# Patient Record
Sex: Male | Born: 1960 | Race: White | Hispanic: Yes | Marital: Married | Smoking: Never smoker
Health system: Southern US, Community
[De-identification: ages and names within clinical notes are randomized; demographics above are authoritative.]

## PROBLEM LIST (undated history)

## (undated) DIAGNOSIS — M1990 Unspecified osteoarthritis, unspecified site: Secondary | ICD-10-CM

## (undated) DIAGNOSIS — E785 Hyperlipidemia, unspecified: Secondary | ICD-10-CM

## (undated) DIAGNOSIS — I10 Essential (primary) hypertension: Secondary | ICD-10-CM

## (undated) DIAGNOSIS — E119 Type 2 diabetes mellitus without complications: Secondary | ICD-10-CM

## (undated) HISTORY — DX: Unspecified osteoarthritis, unspecified site: M19.90

## (undated) HISTORY — DX: Type 2 diabetes mellitus without complications: E11.9

## (undated) HISTORY — DX: Essential (primary) hypertension: I10

## (undated) HISTORY — PX: OTHER SURGICAL HISTORY: SHX169

## (undated) HISTORY — DX: Hyperlipidemia, unspecified: E78.5

---

## 2003-02-10 ENCOUNTER — Emergency Department (HOSPITAL_COMMUNITY): Admission: EM | Admit: 2003-02-10 | Discharge: 2003-02-10 | Payer: Self-pay | Admitting: Emergency Medicine

## 2007-06-01 ENCOUNTER — Emergency Department (HOSPITAL_COMMUNITY): Admission: EM | Admit: 2007-06-01 | Discharge: 2007-06-01 | Payer: Self-pay | Admitting: Emergency Medicine

## 2009-01-31 IMAGING — CT CT HEAD W/O CM
1 series · 16 of 30 positions shown, 20 images · non-contrast
Comparison: None

CLINICAL DATA: Left leg, arm, and facial numbness.

CT HEAD WITHOUT CONTRAST
TECHNIQUE: Contiguous axial images were obtained from the base of
the skull through the vertex without contrast.

[Series 2: brain · axial · 0.47mm/px · z∈[+130,+283]mm · 16 of 32 slices shown, 20 images]
[im 2/32  brain]
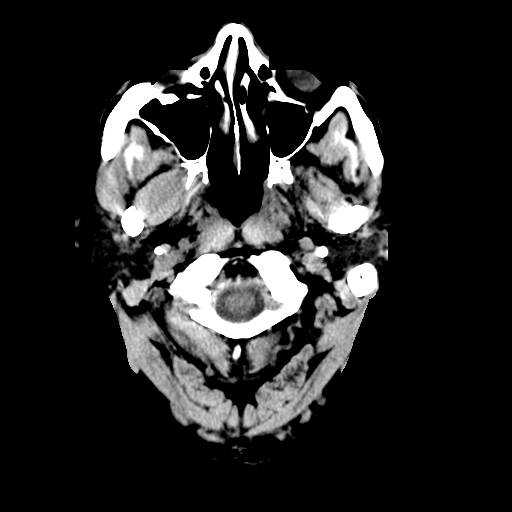
[im 2/32  bone]
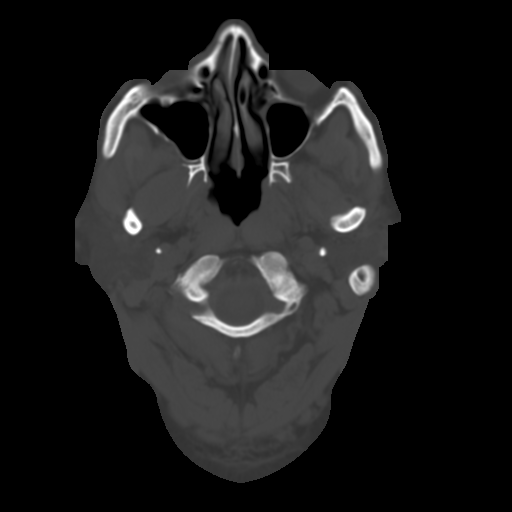
[im 4/32  brain]
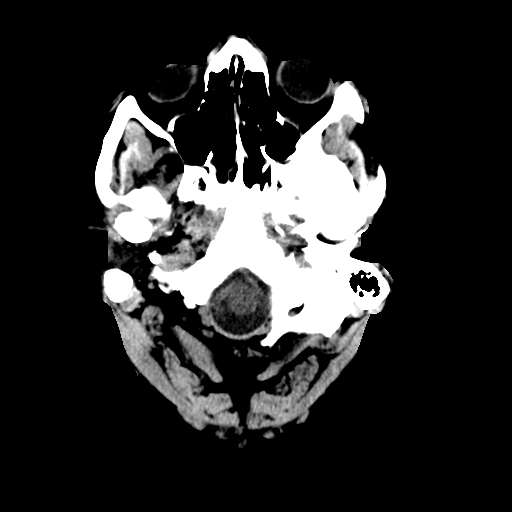
[im 6/32  brain]
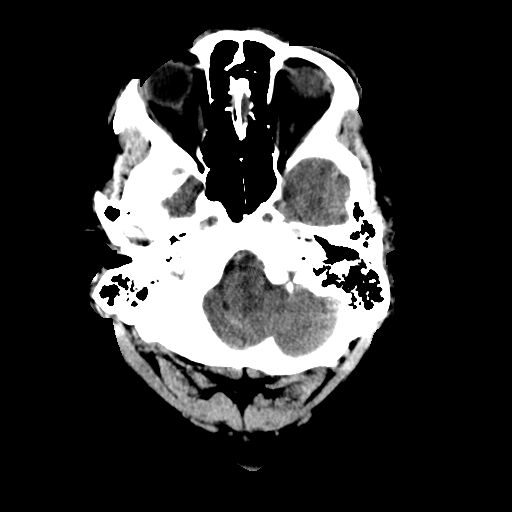
[im 8/32  brain]
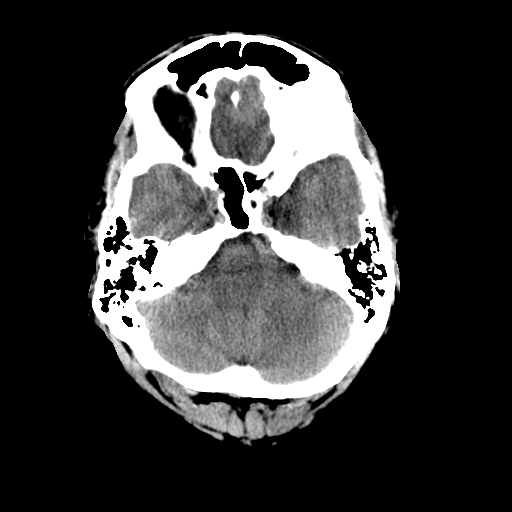
[im 9/32  brain]
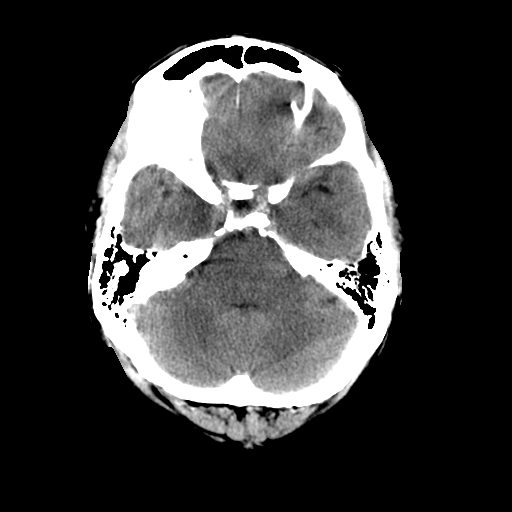
[im 9/32  bone]
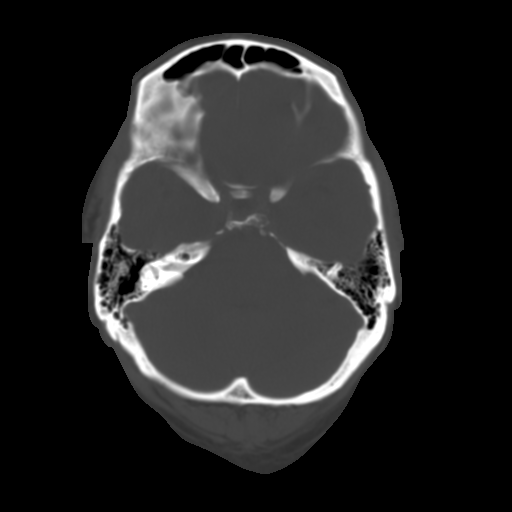
[im 11/32  brain]
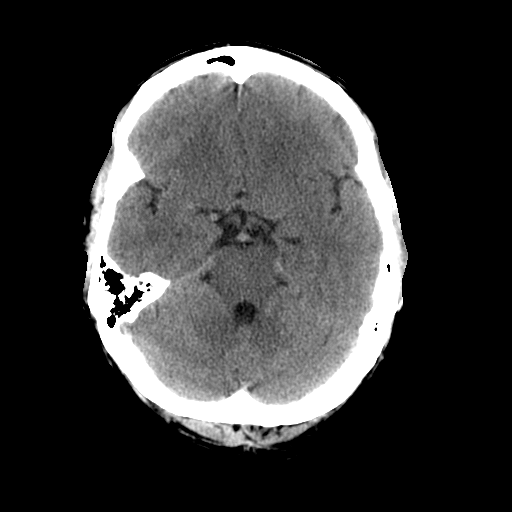
[im 13/32  brain]
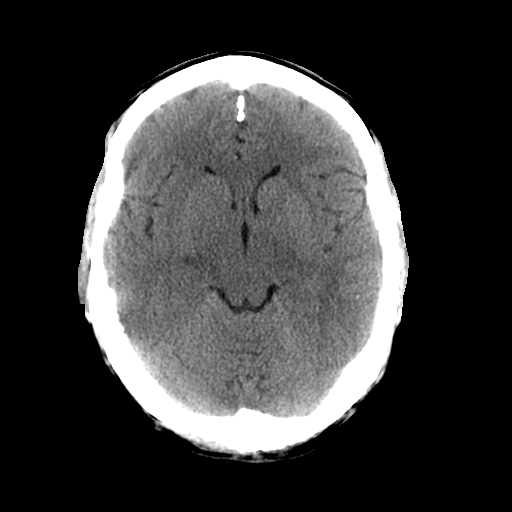
[im 15/32  brain]
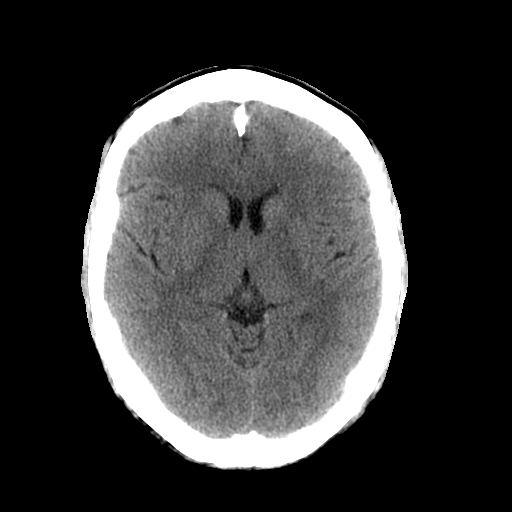
[im 17/32  brain]
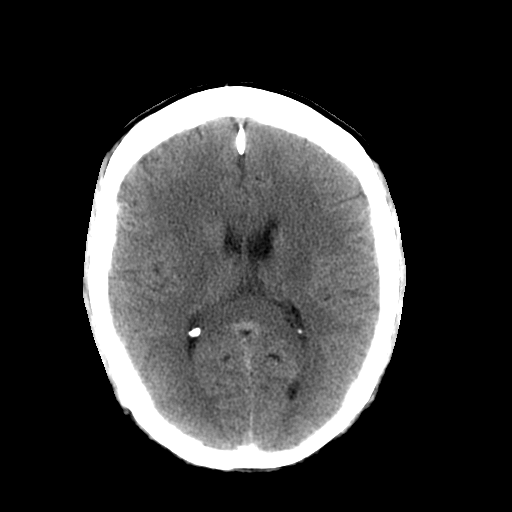
[im 17/32  bone]
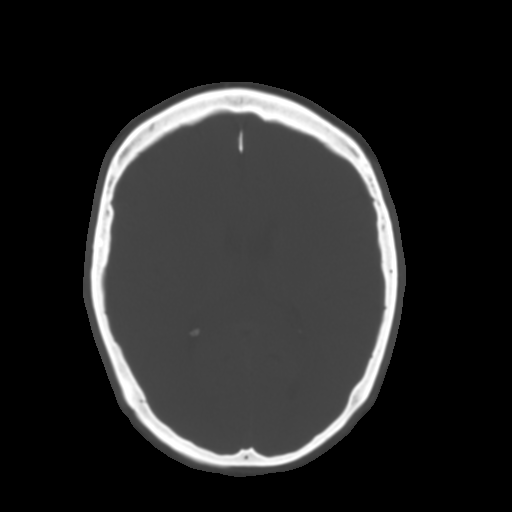
[im 19/32  brain]
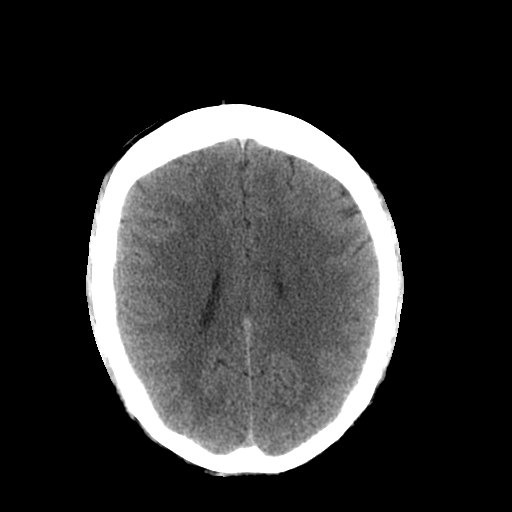
[im 21/32  brain]
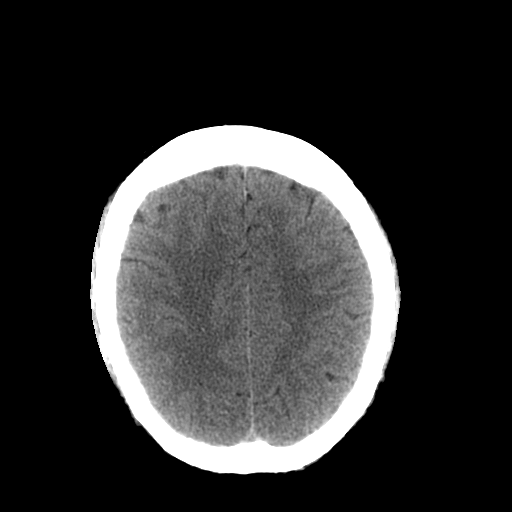
[im 23/32  brain]
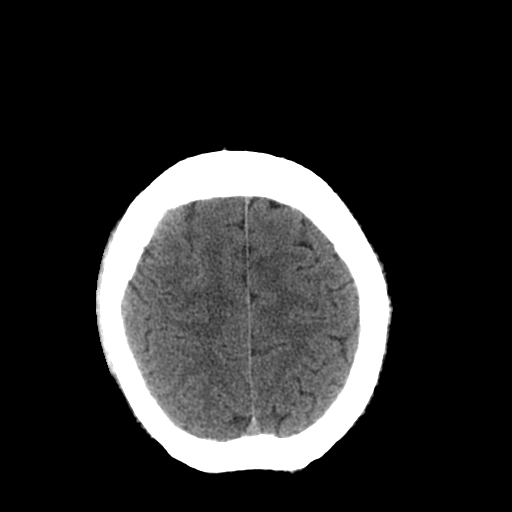
[im 24/32  brain]
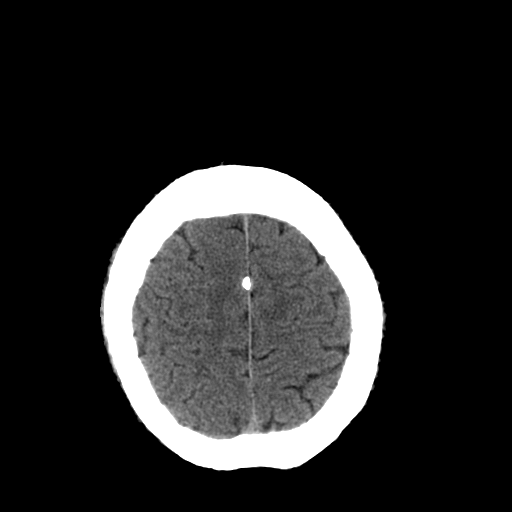
[im 24/32  bone]
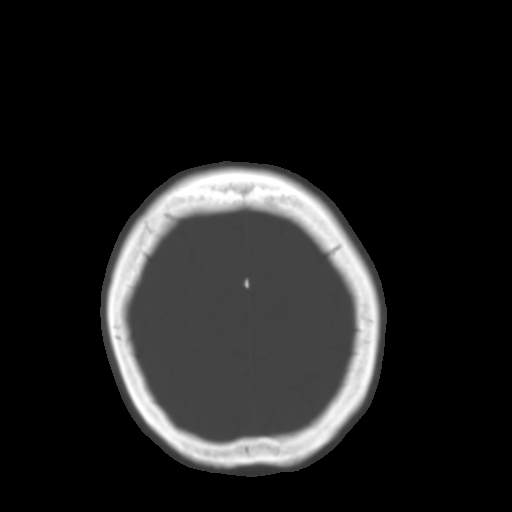
[im 26/32  brain]
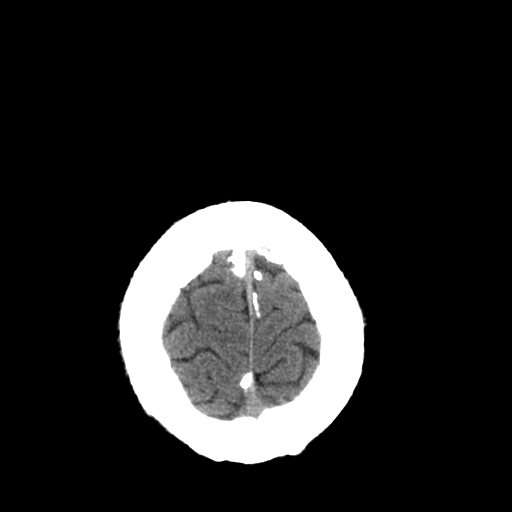
[im 28/32  brain]
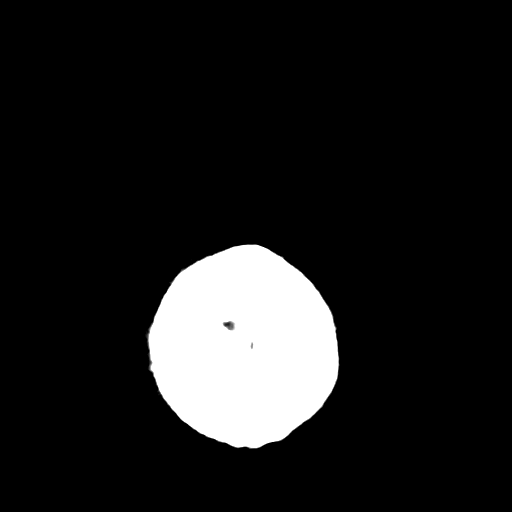
[im 30/32  brain]
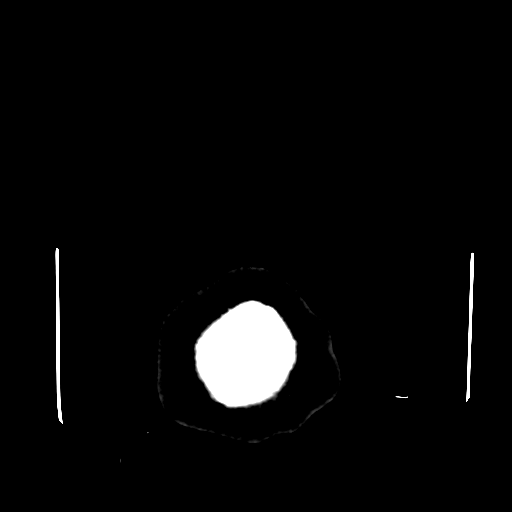

[16 of 30 positions shown; findings below may reference images not displayed]

FINDINGS: ]No acute intracranial abnormalities are identified,
including mass or mass effect, hydrocephalus, extra-axial fluid
collection, midline shift, hemorrhage, or acute infarct.  Please
note that acute infarct may be occult on CT for 24-48 hours.

The visualized bony calvarium is unremarkable.
IMPRESSION: No evidence of acute intracranial abnormality.

## 2010-11-29 LAB — COMPREHENSIVE METABOLIC PANEL
CO2: 27
Calcium: 9
Creatinine, Ser: 0.94
GFR calc Af Amer: >60
GFR calc non Af Amer: >60
Glucose, Bld: 117 — ABNORMAL HIGH
Total Protein: 6.2

## 2010-11-29 LAB — PROTIME-INR
INR: 0.9
Prothrombin Time: 12.7

## 2010-11-29 LAB — CBC
Hemoglobin: 14.5
MCHC: 34.3
MCV: 84.5
RBC: 4.99
RDW: 13.1

## 2010-11-29 LAB — APTT: aPTT: 31

## 2010-11-29 LAB — DIFFERENTIAL
Lymphocytes Relative: 30
Lymphs Abs: 3.2
Neutrophils Relative %: 61

## 2018-05-17 ENCOUNTER — Ambulatory Visit
Admission: RE | Admit: 2018-05-17 | Discharge: 2018-05-17 | Disposition: A | Discharge status: Home / Self Care | Payer: Self-pay | Source: Ambulatory Visit | Attending: Family Medicine | Admitting: Family Medicine

## 2018-05-17 ENCOUNTER — Other Ambulatory Visit: Payer: Self-pay | Admitting: Family Medicine

## 2018-05-17 DIAGNOSIS — R0789 Other chest pain: Secondary | ICD-10-CM

## 2018-05-29 ENCOUNTER — Encounter (INDEPENDENT_AMBULATORY_CARE_PROVIDER_SITE_OTHER): Payer: Self-pay

## 2018-05-29 ENCOUNTER — Encounter: Payer: Self-pay | Admitting: Cardiology

## 2018-05-29 ENCOUNTER — Other Ambulatory Visit: Payer: Self-pay

## 2018-05-29 ENCOUNTER — Ambulatory Visit: Payer: Self-pay | Admitting: Cardiology

## 2018-05-29 ENCOUNTER — Ambulatory Visit (INDEPENDENT_AMBULATORY_CARE_PROVIDER_SITE_OTHER): Payer: Self-pay | Admitting: Cardiology

## 2018-05-29 VITALS — BP 130/72 | HR 67 | Ht 70.0 in | Wt 217.8 lb

## 2018-05-29 DIAGNOSIS — R0602 Shortness of breath: Secondary | ICD-10-CM | POA: Insufficient documentation

## 2018-05-29 DIAGNOSIS — E785 Hyperlipidemia, unspecified: Secondary | ICD-10-CM | POA: Insufficient documentation

## 2018-05-29 DIAGNOSIS — R079 Chest pain, unspecified: Secondary | ICD-10-CM

## 2018-05-29 DIAGNOSIS — R9431 Abnormal electrocardiogram [ECG] [EKG]: Secondary | ICD-10-CM | POA: Insufficient documentation

## 2018-05-29 DIAGNOSIS — I10 Essential (primary) hypertension: Secondary | ICD-10-CM | POA: Insufficient documentation

## 2018-05-29 DIAGNOSIS — I152 Hypertension secondary to endocrine disorders: Secondary | ICD-10-CM | POA: Insufficient documentation

## 2018-05-29 NOTE — Assessment & Plan Note (Signed)
Somewhat atypical features for chest pain as it was pretty much consistent all weekend long and not made worse with exertion.  Unlikely to be anginal, however with the EKG being somewhat concerning for anterior ischemia, I would like to exclude anterior ischemia or any potential structural abnormality that would lead to this EKG.  Since he is not actively having symptoms, I do not feel that this is an emergency evaluation, therefore we will simply schedule him for a Lexiscan Myoview (EKG would not be readable for treadmill) and 2D echocardiogram.  Once these are able to be ordered after the lifting of restrictions from current COVID 19 isolation, we will go ahead and get this test ordered.

## 2018-05-29 NOTE — Assessment & Plan Note (Signed)
Interestingly, he says a lot of his shortness of breath was actually almost consistent with orthopnea.  Would like to get a sense of his EF but also any potential structural abnormality given the abnormal EKG. Plan: 2D echocardiogram.

## 2018-05-29 NOTE — Progress Notes (Signed)
PCP: Kendall Flack, MD  Clinic Note: Chief Complaint  Patient presents with  . New Patient (Initial Visit)    Chest pain, abnormal EKG    HPI: Michael Mitchell is a 58 y.o. male with a past medical history of hypertension and hyperlipidemia as well as prediabetes who is being seen today for the evaluation of INTERMITTENT CHEST PAIN with DYSPNEA ON EXERTION at the request of Kendall Flack, MD.  Michael Mitchell was last seen on May 14, 2018 by Dr. Dorena Cookey.  He described having constant left-sided chest pain described as pressure-like.  Made worse with anxiety and stress.  Alleviated at rest.  Also noted exertional dyspnea.  Also notes dry cough. -Prescribed aspirin as well as sublingual nitroglycerin.  PCP EKG shows sinus rhythm with right superior axis deviation.  Interventricular conduction delay/borderline block.  Precordial T wave inversions V1-V3.  Consider ischemia.  Nonspecific ST and T wave changes in V4-V6 as well as limb leads.  Recent Hospitalizations: None  Studies Personally Reviewed - (if available, images/films reviewed: From Epic Chart or Care Everywhere)  None  Interval History: [name] presents here today for cardiology evaluation, indicating that he really has not had any more symptoms since the weekend in question.  Apparently about 3 weekends ago he started having some constant pressure in the center of his chest that radiated to his left side of his back.  This symptom was pretty much present consistently throughout the weekend until Sunday afternoon when he went out to dinner and then counter forgot about the symptoms.  By Monday morning he woke up and felt better.  He did note that he has some dyspnea but not necessarily worse with exertion.  In fact he said that the chest discomfort and the dyspnea were worse with relaxing and lying back then was sitting up or walking.  He denied any PND or edema.  No rapid irregular heartbeats palpitations just feeling his  heart rate being little faster. No lightheadedness, dizziness or wooziness.  No syncope/near syncope, or TIA/amaurosis fugax symptoms. No melena, hematochezia, hematuria, or epstaxis. No claudication.  Since the Monday after the onset of symptoms, he has not had any further symptoms to speak of.  He says he feels fine now, and is not having any resting or exertional chest pain pressure or dyspnea.  He has not had 1 instance where he thought about potentially using nitroglycerin.  ROS: A comprehensive was performed. Review of Systems  Constitutional: Negative for chills, fever and malaise/fatigue.  Respiratory: Negative for shortness of breath (Only per HPI).   Gastrointestinal: Negative for constipation and heartburn.  Genitourinary: Negative.   Musculoskeletal: Negative.   Neurological: Negative for dizziness.  All other systems reviewed and are negative.    I have reviewed and (if needed) personally updated the patient's problem list, medications, allergies, past medical and surgical history, social and family history.   Past Medical History:  Diagnosis Date  . Essential hypertension    On amlodipine 2.5 mg & lisinopril-HCTZ 20-25 mg daily.  . Hyperlipidemia LDL goal <100    On Lipitor 40 mg daily    Past Surgical History:  Procedure Laterality Date  . None      Current Meds  Medication Sig  . atorvastatin (LIPITOR) 40 MG tablet Take 1 tablet by mouth daily.  Marland Kitchen lisinopril-hydrochlorothiazide (PRINZIDE,ZESTORETIC) 20-25 MG tablet Take 1 tablet by mouth daily.    No Known Allergies  Social History   Tobacco Use  . Smoking status: Never  Smoker  . Smokeless tobacco: Never Used  Substance Use Topics  . Alcohol use: Not on file  . Drug use: Not on file   Social History   Social History Narrative   Father of 2 daughters.    family history includes Diabetes Mellitus II in his father and mother; Hypertension in his mother.  Wt Readings from Last 3 Encounters:   05/29/18 217 lb 12.8 oz (98.8 kg)    PHYSICAL EXAM BP 130/72   Pulse 67   Ht 5\' 10"  (1.778 m)   Wt 217 lb 12.8 oz (98.8 kg)   BMI 31.25 kg/m  Physical Exam  Constitutional: He is oriented to person, place, and time. He appears well-developed and well-nourished. No distress.  Well-groomed.  Healthy-appearing  HENT:  Head: Normocephalic and atraumatic.  Neck: Normal range of motion. Neck supple. No hepatojugular reflux and no JVD present. Carotid bruit is not present.  Cardiovascular: Normal rate, regular rhythm, normal heart sounds and intact distal pulses.  No extrasystoles are present. PMI is not displaced. Exam reveals no gallop and no friction rub.  No murmur heard. Pulmonary/Chest: Effort normal and breath sounds normal. No respiratory distress. He has no wheezes. He has no rales. He exhibits no tenderness.  Abdominal: Soft. Bowel sounds are normal. He exhibits no distension. There is no abdominal tenderness. There is no rebound.  Musculoskeletal: Normal range of motion.        General: No edema.  Neurological: He is alert and oriented to person, place, and time.  Skin: Skin is warm and dry. No rash noted. No erythema.  Psychiatric: He has a normal mood and affect. Judgment and thought content normal.  Vitals reviewed.    Adult ECG Report  Rate: 67 ;  Rhythm: normal sinus rhythm and ~IVCD/borderline incomplete RBBB.  Biphasic T wave inversions in V1-V5 with nonspecific changes in other leads.  Otherwise normal axis, intervals and durations;   Narrative Interpretation: Borderline EKG   Other studies Reviewed: Additional studies/ records that were reviewed today include:  Recent Labs: Labs checked by PCP, but not available.   ASSESSMENT / PLAN: Problem List Items Addressed This Visit    Abnormal electrocardiogram   Relevant Orders   ECHOCARDIOGRAM COMPLETE   MYOCARDIAL PERFUSION IMAGING   Chest pain with moderate risk for cardiac etiology - Primary    Somewhat atypical  features for chest pain as it was pretty much consistent all weekend long and not made worse with exertion.  Unlikely to be anginal, however with the EKG being somewhat concerning for anterior ischemia, I would like to exclude anterior ischemia or any potential structural abnormality that would lead to this EKG.  Since he is not actively having symptoms, I do not feel that this is an emergency evaluation, therefore we will simply schedule him for a Lexiscan Myoview (EKG would not be readable for treadmill) and 2D echocardiogram.  Once these are able to be ordered after the lifting of restrictions from current COVID 19 isolation, we will go ahead and get this test ordered.      Essential hypertension (Chronic)    Well-controlled blood pressure on current medications.  No change for now.      Relevant Medications   atorvastatin (LIPITOR) 40 MG tablet   lisinopril-hydrochlorothiazide (PRINZIDE,ZESTORETIC) 20-25 MG tablet   Hyperlipidemia LDL goal <100 (Chronic)    On statin.  Managed by PCP.  Labs not available; depending on ischemic evaluation may need to be more strict in management.  Relevant Medications   atorvastatin (LIPITOR) 40 MG tablet   lisinopril-hydrochlorothiazide (PRINZIDE,ZESTORETIC) 20-25 MG tablet   Shortness of breath    Interestingly, he says a lot of his shortness of breath was actually almost consistent with orthopnea.  Would like to get a sense of his EF but also any potential structural abnormality given the abnormal EKG. Plan: 2D echocardiogram.      Relevant Orders   ECHOCARDIOGRAM COMPLETE   MYOCARDIAL PERFUSION IMAGING       I spent a total of 51minutes with the patient and chart review. >  50% of the time was spent in direct patient consultation.   Current medicines are reviewed at length with the patient today.  (+/- concerns) n/a The following changes have been made:  n/a  Patient Instructions  Medication Instructions:  Not needed If you need a  refill on your cardiac medications before your next appointment, please call your pharmacy.   Lab work: Not needed If you have labs (blood work) drawn today and your tests are completely normal, you will receive your results only by: Marland Kitchen MyChart Message (if you have MyChart) OR . A paper copy in the mail If you have any lab test that is abnormal or we need to change your treatment, we will call you to review the results.  Testing/Procedures:  Will be schedule at [place] has requested that you have an echocardiogram. Echocardiography is a painless test that uses sound waves to create images of your heart. It provides your doctor with information about the size and shape of your heart and how well your heart's chambers and valves are working. This procedure takes approximately one hour. There are no restrictions for this procedure. And  Will be schedule at [place] has requested that you have a lexiscan myoview. For further information please visit [email or web address]. Please follow instruction sheet, as given.      Follow-Up: At [facility], you and your health needs are our priority.  As part of our continuing mission to provide you with exceptional heart care, we have created designated Provider Care Teams.  These Care Teams include your primary Cardiologist (physician) and Advanced Practice Providers (APPs -  Physician Assistants and Nurse Practitioners) who all work together to provide you with the care you need, when you need it. . Your physician recommends that you schedule a follow-up appointment in 3 MONTHS AFTER TEST IS COMPLETED .   Any Other Special Instructions Will Be Listed Below (If Applicable).       Studies Ordered:   Orders Placed This Encounter  Procedures  . MYOCARDIAL PERFUSION IMAGING  . ECHOCARDIOGRAM COMPLETE      Glenetta Hew, M.D., M.S. Interventional Cardiologist    Pager # [phone] Phone # [phone] [address] [place],  [address]   Thank you for choosing Heartcare at [facility]!!

## 2018-05-29 NOTE — Patient Instructions (Signed)
Medication Instructions:  Not needed If you need a refill on your cardiac medications before your next appointment, please call your pharmacy.   Lab work: Not needed If you have labs (blood work) drawn today and your tests are completely normal, you will receive your results only by: Marland Kitchen MyChart Message (if you have MyChart) OR . A paper copy in the mail If you have any lab test that is abnormal or we need to change your treatment, we will call you to review the results.  Testing/Procedures:  Will be schedule at [place] has requested that you have an echocardiogram. Echocardiography is a painless test that uses sound waves to create images of your heart. It provides your doctor with information about the size and shape of your heart and how well your heart's chambers and valves are working. This procedure takes approximately one hour. There are no restrictions for this procedure. And  Will be schedule at [place] has requested that you have a lexiscan myoview. For further information please visit [email or web address]. Please follow instruction sheet, as given.      Follow-Up: At [facility], you and your health needs are our priority.  As part of our continuing mission to provide you with exceptional heart care, we have created designated Provider Care Teams.  These Care Teams include your primary Cardiologist (physician) and Advanced Practice Providers (APPs -  Physician Assistants and Nurse Practitioners) who all work together to provide you with the care you need, when you need it. . Your physician recommends that you schedule a follow-up appointment in 3 MONTHS AFTER TEST IS COMPLETED .   Any Other Special Instructions Will Be Listed Below (If Applicable).

## 2018-05-29 NOTE — Assessment & Plan Note (Signed)
Well-controlled blood pressure on current medications.  No change for now.

## 2018-05-29 NOTE — Assessment & Plan Note (Signed)
On statin.  Managed by PCP.  Labs not available; depending on ischemic evaluation may need to be more strict in management.

## 2018-06-01 NOTE — Addendum Note (Signed)
Addended by: Zebedee Iba on: 06/01/2018 08:22 AM   Modules accepted: Orders

## 2018-07-26 ENCOUNTER — Inpatient Hospital Stay (HOSPITAL_COMMUNITY): Admission: RE | Admit: 2018-07-26 | Payer: Self-pay | Source: Ambulatory Visit

## 2018-08-06 ENCOUNTER — Telehealth (HOSPITAL_COMMUNITY): Payer: Self-pay

## 2018-08-06 NOTE — Telephone Encounter (Signed)

## 2018-08-06 NOTE — Telephone Encounter (Signed)
LMTCB COVID prescreening for echo. 

## 2018-08-07 ENCOUNTER — Ambulatory Visit (HOSPITAL_COMMUNITY): Payer: BC Managed Care – PPO | Attending: Cardiology

## 2018-08-07 ENCOUNTER — Other Ambulatory Visit: Payer: Self-pay

## 2018-08-07 DIAGNOSIS — R0602 Shortness of breath: Secondary | ICD-10-CM | POA: Insufficient documentation

## 2018-08-07 DIAGNOSIS — R9431 Abnormal electrocardiogram [ECG] [EKG]: Secondary | ICD-10-CM | POA: Diagnosis present

## 2018-08-08 NOTE — Progress Notes (Signed)
Echo results: Good news: Essentially normal echocardiogram and normal pump function and normal valve function.  No signs to suggest heart attack.. EF: 50-55%. No regional wall motion abnormalities

## 2018-09-04 ENCOUNTER — Encounter: Payer: Self-pay | Admitting: *Deleted

## 2018-09-05 ENCOUNTER — Encounter (HOSPITAL_COMMUNITY): Payer: Self-pay | Admitting: *Deleted

## 2018-09-05 ENCOUNTER — Ambulatory Visit (HOSPITAL_COMMUNITY)
Admission: RE | Admit: 2018-09-05 | Discharge: 2018-09-05 | Disposition: A | Discharge status: Home / Self Care | Payer: BC Managed Care – PPO | Source: Ambulatory Visit | Attending: Cardiovascular Disease | Admitting: Cardiovascular Disease

## 2018-09-05 ENCOUNTER — Other Ambulatory Visit: Payer: Self-pay

## 2018-09-05 DIAGNOSIS — R0602 Shortness of breath: Secondary | ICD-10-CM | POA: Insufficient documentation

## 2018-09-05 DIAGNOSIS — R9431 Abnormal electrocardiogram [ECG] [EKG]: Secondary | ICD-10-CM | POA: Diagnosis not present

## 2018-09-05 LAB — MYOCARDIAL PERFUSION IMAGING
LV dias vol: 114 mL (ref 62–150)
LV sys vol: 56 mL
Peak HR: 91 {beats}/min
Rest HR: 62 {beats}/min
SDS: 2
SRS: 1
SSS: 3
TID: 1.11

## 2018-09-05 MED ORDER — TECHNETIUM TC 99M TETROFOSMIN IV KIT
10.3000 | PACK | Freq: Once | INTRAVENOUS | Status: AC | PRN
  Administered 2018-09-05: 10.3 via INTRAVENOUS
  Filled 2018-09-05: qty 11

## 2018-09-05 MED ORDER — TECHNETIUM TC 99M TETROFOSMIN IV KIT
29.9000 | PACK | Freq: Once | INTRAVENOUS | Status: AC | PRN
  Administered 2018-09-05: 29.9 via INTRAVENOUS
  Filled 2018-09-05: qty 30

## 2018-09-05 MED ORDER — REGADENOSON 0.4 MG/5ML IV SOLN
0.4000 mg | Freq: Once | INTRAVENOUS | Status: AC
  Administered 2018-09-05: 0.4 mg via INTRAVENOUS

## 2018-09-05 NOTE — Progress Notes (Signed)
Michael Mitchell (CAP) [phone] interpreter assisting with MPI study today.

## 2018-11-08 ENCOUNTER — Encounter: Payer: Self-pay | Admitting: Emergency Medicine

## 2018-11-08 ENCOUNTER — Other Ambulatory Visit: Payer: Self-pay

## 2018-11-08 ENCOUNTER — Ambulatory Visit: Payer: BC Managed Care – PPO | Admitting: Emergency Medicine

## 2018-11-08 VITALS — BP 120/74 | HR 71 | Temp 98.7°F | Resp 18 | Ht 66.93 in | Wt 217.6 lb

## 2018-11-08 DIAGNOSIS — I10 Essential (primary) hypertension: Secondary | ICD-10-CM

## 2018-11-08 DIAGNOSIS — N529 Male erectile dysfunction, unspecified: Secondary | ICD-10-CM | POA: Diagnosis not present

## 2018-11-08 DIAGNOSIS — E1165 Type 2 diabetes mellitus with hyperglycemia: Secondary | ICD-10-CM

## 2018-11-08 DIAGNOSIS — Z1211 Encounter for screening for malignant neoplasm of colon: Secondary | ICD-10-CM

## 2018-11-08 DIAGNOSIS — Z23 Encounter for immunization: Secondary | ICD-10-CM

## 2018-11-08 DIAGNOSIS — E785 Hyperlipidemia, unspecified: Secondary | ICD-10-CM

## 2018-11-08 DIAGNOSIS — Z7689 Persons encountering health services in other specified circumstances: Secondary | ICD-10-CM

## 2018-11-08 MED ORDER — METFORMIN HCL 500 MG PO TABS: 500.0000 mg | ORAL_TABLET | Freq: Every day | ORAL | 3 refills | Status: DC

## 2018-11-08 MED ORDER — ATORVASTATIN CALCIUM 40 MG PO TABS: 40.0000 mg | ORAL_TABLET | Freq: Every day | ORAL | 3 refills | Status: DC

## 2018-11-08 MED ORDER — LISINOPRIL-HYDROCHLOROTHIAZIDE 20-25 MG PO TABS: 1.0000 | ORAL_TABLET | Freq: Every day | ORAL | 3 refills | Status: DC

## 2018-11-08 MED ORDER — SILDENAFIL CITRATE 100 MG PO TABS: 50.0000 mg | ORAL_TABLET | Freq: Every day | ORAL | 11 refills | Status: DC | PRN

## 2018-11-08 NOTE — Progress Notes (Signed)
Michael Mitchell 58 y.o.   Chief Complaint  Patient presents with   Establish Care    No concerns today.  Previous PCP left area.     HISTORY OF PRESENT ILLNESS: This is a 58 y.o. male here to establish care with me.  Has the following chronic medical problems: 1.  Hypertension: On Zestoretic 20-25 once a day. 2.  Diabetes type 2: On metformin 500 mg twice a day. 3.  Dyslipidemia: On Lipitor 40 mg once a day. Concerned about erectile dysfunction. No other complaints or medical concerns today.  HPI   Prior to Admission medications   Medication Sig Start Date End Date Taking? Authorizing Provider  metFORMIN (GLUCOPHAGE) 500 MG tablet Take 500 mg by mouth daily at 6 PM.   Yes [provider]  atorvastatin (LIPITOR) 40 MG tablet Take 1 tablet by mouth daily. 12/23/17   [provider]  lisinopril-hydrochlorothiazide (PRINZIDE,ZESTORETIC) 20-25 MG tablet Take 1 tablet by mouth daily.    [provider]    No Known Allergies  Patient Active Problem List   Diagnosis Date Noted   Abnormal electrocardiogram 05/29/2018   Essential hypertension 05/29/2018   Hyperlipidemia LDL goal <100 05/29/2018    Past Medical History:  Diagnosis Date   Diabetes mellitus without complication ([place])    Essential hypertension    On amlodipine 2.5 mg & lisinopril-HCTZ 20-25 mg daily.   Hyperlipidemia LDL goal <100    On Lipitor 40 mg daily    Past Surgical History:  Procedure Laterality Date   None      Social History   Socioeconomic History   Marital status: Married    Spouse name: Not on file   Number of children: Not on file   Years of education: Not on file   Highest education level: Not on file  Occupational History   Not on file  Social Needs   Financial resource strain: Not on file   Food insecurity    Worry: Not on file    Inability: Not on file   Transportation needs    Medical: Not on file    Non-medical: Not on file  Tobacco  Use   Smoking status: Never Smoker   Smokeless tobacco: Never Used  Substance and Sexual Activity   Alcohol use: Never    Frequency: Never   Drug use: Never   Sexual activity: Not on file  Lifestyle   Physical activity    Days per week: Not on file    Minutes per session: Not on file   Stress: Not on file  Relationships   Social connections    Talks on phone: Not on file    Gets together: Not on file    Attends religious service: Not on file    Active member of club or organization: Not on file    Attends meetings of clubs or organizations: Not on file    Relationship status: Not on file   Intimate partner violence    Fear of current or ex partner: Not on file    Emotionally abused: Not on file    Physically abused: Not on file    Forced sexual activity: Not on file  Other Topics Concern   Not on file  Social History Narrative   Father of 2 daughters.    Family History  Problem Relation Age of Onset   Diabetes Mellitus II Mother    Hypertension Mother    Diabetes Mellitus II Father  Review of Systems  Constitutional: Negative.  Negative for chills, fever and weight loss.  HENT: Negative.  Negative for congestion and sore throat.   Eyes: Negative.   Respiratory: Negative.  Negative for cough and shortness of breath.   Cardiovascular: Negative.  Negative for chest pain and palpitations.  Gastrointestinal: Negative for abdominal pain, blood in stool, diarrhea, melena, nausea and vomiting.  Genitourinary: Negative.  Negative for dysuria and hematuria.       Erectile dysfunction  Musculoskeletal: Negative.  Negative for back pain, myalgias and neck pain.  Skin: Negative.  Negative for rash.  Neurological: Negative for dizziness and headaches.  All other systems reviewed and are negative.  Today's Vitals   11/08/18 1354  BP: 120/74  Pulse: 71  Resp: 18  Temp: 98.7 F (37.1 C)  TempSrc: Oral  SpO2: 95%  Weight: 217 lb 9.6 oz (98.7 kg)  Height:  5' 6.93" (1.7 m)   Body mass index is 34.15 kg/m.   Physical Exam Vitals signs reviewed.  Constitutional:      Appearance: Normal appearance.  HENT:     Head: Normocephalic and atraumatic.  Eyes:     Extraocular Movements: Extraocular movements intact.     Conjunctiva/sclera: Conjunctivae normal.     Pupils: Pupils are equal, round, and reactive to light.  Neck:     Musculoskeletal: Normal range of motion and neck supple.  Cardiovascular:     Rate and Rhythm: Normal rate and regular rhythm.     Pulses: Normal pulses.     Heart sounds: Normal heart sounds.  Pulmonary:     Effort: Pulmonary effort is normal.     Breath sounds: Normal breath sounds.  Abdominal:     Palpations: Abdomen is soft.     Tenderness: There is no abdominal tenderness.  Musculoskeletal: Normal range of motion.  Skin:    General: Skin is warm and dry.     Capillary Refill: Capillary refill takes less than 2 seconds.  Neurological:     General: No focal deficit present.     Mental Status: He is alert and oriented to person, place, and time.  Psychiatric:        Mood and Affect: Mood normal.        Behavior: Behavior normal.      ASSESSMENT & PLAN: Michai was seen today for establish care.  Diagnoses and all orders for this visit:  Essential hypertension -     lisinopril-hydrochlorothiazide (ZESTORETIC) 20-25 MG tablet; Take 1 tablet by mouth daily.  Hyperlipidemia LDL goal <100 -     atorvastatin (LIPITOR) 40 MG tablet; Take 1 tablet (40 mg total) by mouth daily.  Encounter to establish care  Need for prophylactic vaccination and inoculation against influenza -     Flu Vaccine QUAD 6+ mos PF IM (Fluarix Quad PF)  Type 2 diabetes mellitus with hyperglycemia, without long-term current use of insulin (HCC) -     metFORMIN (GLUCOPHAGE) 500 MG tablet; Take 1 tablet (500 mg total) by mouth daily at 6 PM.  Erectile dysfunction, unspecified erectile dysfunction type -     sildenafil (VIAGRA)  100 MG tablet; Take 0.5-1 tablets (50-100 mg total) by mouth daily as needed for erectile dysfunction.  Colon cancer screening -     Ambulatory referral to Gastroenterology    Patient Instructions     Hipertensin en los adultos Hypertension, Adult El trmino hipertensin es otra forma de denominar a la presin arterial elevada. La presin arterial elevada fuerza al  corazn a trabajar ms para Herbalist. Esto puede causar problemas con el paso del [place]. Una lectura de presin arterial est compuesta por 2 nmeros. Hay un nmero superior (sistlico) sobre un nmero inferior (diastlico). Lo ideal es tener la presin arterial por debajo de 120/80. Las elecciones saludables pueden ayudar a Engineer, materials presin arterial, o tal vez necesite medicamentos para bajarla. Cules son las causas? Se desconoce la causa de esta afeccin. Algunas afecciones pueden estar relacionadas con la presin arterial alta. Qu incrementa el riesgo?  Fumar.  Tener diabetes mellitus tipo 2, colesterol alto, o ambos.  No hacer la cantidad suficiente de actividad fsica o ejercicio.  Tener sobrepeso.  Consumir mucha grasa, azcar, caloras o sal (sodio) en su dieta.  Beber alcohol en exceso.  Tener una enfermedad renal a largo plazo (crnica).  Tener antecedentes familiares de presin arterial alta.  Edad. Los riesgos aumentan con la edad.  [place]aza. El riesgo es mayor para las Retail banker.  Sexo. Antes de los 45aos, los hombres corren ms [employer]. Despus de los 65aos, las mujeres corren ms [employer].  Tener apnea obstructiva del sueo.  Estrs. Cules son los signos o los sntomas?  Es posible que la presin arterial alta puede no cause sntomas. La presin arterial muy alta (crisis hipertensiva) puede provocar: ? Dolor de [place]. ? Sensaciones de preocupacin o nerviosismo (ansiedad). ? Falta de aire. ? Hemorragia nasal. ? Sensacin de  Engineer, site (nuseas). ? Vmitos. ? Cambios en la forma de ver. ? Dolor muy intenso en el pecho. ? Convulsiones. Cmo se trata?  Esta afeccin se trata haciendo cambios saludables en el estilo de vida, por ejemplo: ? Consumir alimentos saludables. ? Hacer ms ejercicio. ? Beber menos alcohol.  El mdico puede recetarle medicamentos si los cambios en el estilo de vida no son suficientes para Child psychotherapist la presin arterial y si: ? El nmero de arriba est por encima de 130. ? El nmero de abajo est por encima de 80.  Su presin arterial personal ideal puede variar. Siga estas instrucciones en su casa: Comida y bebida   Si se lo dicen, siga el plan de alimentacin de DASH (Dietary Approaches to Stop Hypertension, Maneras de alimentarse para detener la hipertensin). Para seguir este plan: ? Llene la mitad del plato de cada comida con frutas y verduras. ? Llene un cuarto del plato de cada comida con cereales integrales. Los cereales integrales incluyen pasta integral, arroz integral y pan integral. ? Coma y beba productos lcteos con bajo contenido de grasa, como leche descremada o yogur bajo en grasas. ? Llene un cuarto del plato de cada comida con protenas bajas en grasa (magras). Las protenas bajas en grasa incluyen pescado, pollo sin piel, huevos, frijoles y tofu. ? Evite consumir carne grasa, carne curada y procesada, o pollo con piel. ? Evite consumir alimentos prehechos o procesados.  Consuma menos de 1500 mg de sal por da.  No beba alcohol si: ? El mdico le indica que no lo haga. ? Est embarazada, puede estar embarazada o est tratando de quedar embarazada.  Si bebe alcohol: ? Limite la cantidad que bebe a lo siguiente:  De 0 a 1 medida por da para las mujeres.  De 0 a 2 medidas por da para los hombres. ? Est atento a la cantidad de alcohol que hay en las bebidas que toma. En los [place], una medida [place] a una botella de Chartered certified accountant de  12oz (339ml), un vaso de vino de 5oz (127ml) o un vaso de una bebida alcohlica de alta graduacin de 1oz (78ml). Estilo de vida   Trabaje con su mdico para mantenerse en un peso saludable o para perder peso. Pregntele a su mdico cul es el peso recomendable para usted.  Haga al menos 52minutos de ejercicio la [employer] de la [place]. Estos pueden incluir caminar, nadar o andar en bicicleta.  Realice al menos 30 minutos de ejercicio que fortalezca sus msculos (ejercicios de resistencia) al menos 3 das a la [place]. Estos pueden incluir levantar pesas o hacer [place].  No consuma ningn producto que contenga nicotina o tabaco, como cigarrillos, cigarrillos electrnicos y tabaco de Higher education careers adviser. Si necesita ayuda para dejar de fumar, consulte al [employer].  Controle su presin arterial en su casa tal como le indic el mdico.  Concurra a todas las visitas de seguimiento como se lo haya indicado el mdico. Esto es importante. Medicamentos  [employer] de venta libre y los recetados solamente como se lo haya indicado el mdico. Siga cuidadosamente las indicaciones.  No omita las dosis de medicamentos para la presin arterial. Los medicamentos pierden eficacia si omite dosis. El hecho de omitir las dosis tambin [place] el riesgo de otros problemas.  Pregntele a su mdico a qu efectos secundarios o reacciones a los Careers information officer. Comunquese con un mdico si:  Piensa que tiene [place] reaccin a los medicamentos que est tomando.  Tiene dolores de cabeza frecuentes (recurrentes).  Se siente mareado.  Tiene hinchazn en los tobillos.  Tiene problemas de visin. Solicite ayuda inmediatamente si:  Siente un dolor de cabeza muy intenso.  Empieza a sentirse desorientado (confundido).  Se siente dbil o adormecido.  Siente que va a desmayarse.  Tiene un dolor muy intenso en las siguientes zonas: ? Pecho. ? Vientre (abdomen).  Vomita ms de  una vez.  Tiene dificultad para respirar. Resumen  El trmino hipertensin es otra forma de denominar a la presin arterial elevada.  La presin arterial elevada fuerza al corazn a trabajar ms para bombear la sangre.  Para la [employer], una presin arterial normal es menor que 120/80.  Las decisiones saludables pueden ayudarle a disminuir su presin arterial. Si no puede bajar su presin arterial mediante decisiones saludables, es posible que deba tomar medicamentos. Esta informacin no tiene Marine scientist el consejo del mdico. Asegrese de hacerle al mdico cualquier pregunta que tenga. Document Released: 08/11/2009 Document Revised: 12/07/2017 Document Reviewed: 12/07/2017 Elsevier Patient Education  2020 [employer].  Diabetes mellitus y nutricin, en adultos Diabetes Mellitus and Nutrition, Adult Si sufre de diabetes (diabetes mellitus), es muy importante tener hbitos alimenticios saludables debido a que sus niveles de Designer, television/film set sangre (glucosa) se ven afectados en gran medida por lo que come y bebe. [place] alimentos saludables en las cantidades [place], aproximadamente a la [employer], [place] ayudar a:  Aeronautical engineer glucemia.  Disminuir el riesgo de sufrir una enfermedad cardaca.  Mejorar la presin arterial.  Science writer o mantener un peso saludable. Todas las personas que sufren de diabetes son diferentes y cada una tiene necesidades diferentes en cuanto a un plan de alimentacin. El mdico puede recomendarle que trabaje con un especialista en dietas y nutricin (nutricionista) para Financial trader plan para usted. Su plan de alimentacin puede variar segn factores como:  Las caloras que necesita.  Los medicamentos que toma.  Su peso.  Sus niveles  de glucemia, presin arterial y colesterol.  Su nivel de [place].  Otras afecciones que tenga, como enfermedades cardacas o renales. Cmo me afectan los carbohidratos? Los  carbohidratos, o hidratos de carbono, afectan su nivel de glucemia ms que cualquier otro tipo de alimento. La ingesta de carbohidratos naturalmente aumenta la cantidad de [employer]. El recuento de carbohidratos es un mtodo destinado a Catering manager un registro de la cantidad de carbohidratos que se consumen. El recuento de carbohidratos es importante para Theatre manager la glucemia a un nivel saludable, especialmente si utiliza insulina o toma determinados medicamentos por va oral para la diabetes. Es importante conocer la cantidad de carbohidratos que se pueden ingerir en cada comida sin correr Engineer, manufacturing. Esto es Psychologist, forensic. Su nutricionista puede ayudarlo a calcular la cantidad de carbohidratos que debe ingerir en cada comida y en cada refrigerio. Entre los alimentos que contienen carbohidratos, se incluyen:  Pan, cereal, arroz, pastas y galletas.  Papas y maz.  Guisantes, frijoles y lentejas.  Leche y Estate agent.  Lambert Mody y [place].  Postres, como pasteles, galletas, helado y caramelos. Cmo me afecta el alcohol? El alcohol puede provocar disminuciones sbitas de la glucemia (hipoglucemia), especialmente si utiliza insulina o toma determinados medicamentos por va oral para la diabetes. La hipoglucemia es una afeccin potencialmente mortal. Los sntomas de la hipoglucemia (somnolencia, mareos y confusin) son similares a los sntomas de haber consumido demasiado alcohol. Si el mdico afirma que el alcohol es seguro para usted, [place] estas pautas:  Limite el consumo de alcohol a no ms de 19medida por da si es mujer y no est [place], y a 25medidas si es hombre. Una medida equivale a 12oz (323ml) de cerveza, 5oz (143ml) de vino o 1oz (71ml) de bebidas alcohlicas de alta graduacin.  No beba con el estmago vaco.  Mantngase hidratado bebiendo agua, refrescos dietticos o t helado sin azcar.  Tenga en cuenta que los refrescos comunes, los jugos y otras bebida para  Optician, dispensing pueden contener mucha azcar y se deben contar como carbohidratos. Cules son algunos consejos para seguir este plan?  Leer las etiquetas de los alimentos  Comience por leer el tamao de la porcin en la Informacin nutricional en las etiquetas de los alimentos envasados y las bebidas. La cantidad de caloras, carbohidratos, grasas y otros nutrientes mencionados en la etiqueta se basan en una porcin del alimento. Muchos alimentos contienen ms de una porcin por envase.  Verifique la cantidad total de gramos (g) de carbohidratos totales en una porcin. Puede calcular la cantidad de porciones de carbohidratos al dividir el total de carbohidratos por 15. Por ejemplo, si un alimento tiene un total de 30g de carbohidratos, equivale a 2 porciones de carbohidratos.  Verifique la cantidad de gramos (g) de grasas saturadas y grasas trans en una porcin. Escoja alimentos que no contengan grasa o que tengan un bajo contenido.  Verifique la cantidad de miligramos (mg) de sal (sodio) en una porcin. La mayora de las personas deben limitar la ingesta de sodio total a menos de 2300mg  por Training and development officer.  Siempre consulte la informacin nutricional de los alimentos etiquetados como con bajo contenido de [place] o sin grasa. Estos alimentos pueden tener un mayor contenido de Location manager agregada o carbohidratos refinados, y deben evitarse.  Hable con su nutricionista para identificar sus objetivos diarios en cuanto a los nutrientes mencionados en la etiqueta. Al ir de compras  Evite comprar alimentos procesados, enlatados o precocinados. Estos alimentos tienden a Special educational needs teacher mayor cantidad de  Anitra Lauth y azcar agregada.  Compre en la zona exterior de la tienda de comestibles. Esta zona incluye frutas y verduras frescas, granos a granel, carnes frescas y productos lcteos frescos. Al cocinar  Utilice mtodos de coccin a baja temperatura, como hornear, en lugar de mtodos de coccin a alta temperatura, como  frer en abundante aceite.  Cocine con aceites saludables, como el aceite de [place], canola o [place].  Evite cocinar con manteca, crema o carnes con alto contenido de grasa. Planificacin de las comidas  Coma las comidas y los refrigerios regularmente, preferentemente a la misma hora todos [place]. Evite pasar largos perodos de tiempo sin comer.  Consuma alimentos ricos en fibra, como frutas frescas, verduras, frijoles y cereales integrales. Consulte a su nutricionista sobre cuntas porciones de carbohidratos puede consumir en cada comida.  Consuma entre 4 y 6 onzas (oz) de protenas magras por da, como carnes [place], pollo, pescado, huevos o tofu. Una onza de protena magra equivale a: ? 1 onza de carne, pollo o pescado. ? 1huevo. ?  taza de tofu.  Coma algunos alimentos por da que contengan grasas saludables, como aguacates, frutos secos, semillas y pescado. Estilo de vida  Controle su nivel de glucemia con regularidad.  Haga actividad fsica habitualmente como se lo haya indicado el mdico. Esto puede incluir lo siguiente: ? 138minutos semanales de ejercicio de intensidad moderada o alta. Esto podra incluir caminatas dinmicas, ciclismo o gimnasia acutica. ? Realizar ejercicios de elongacin y de fortalecimiento, como yoga o levantamiento de pesas, por lo menos 2veces por semana.  Tome los [employer] se lo haya indicado el mdico.  No consuma ningn producto que contenga nicotina o tabaco, como cigarrillos y Psychologist, sport and exercise. Si necesita ayuda para dejar de fumar, consulte al [employer] con un asesor o instructor en diabetes para identificar estrategias para controlar el estrs y cualquier desafo emocional y social. Preguntas para hacerle al mdico  Es necesario que consulte a Radio broadcast assistant en el cuidado de la diabetes?  Es necesario que me rena con un nutricionista?  A qu nmero puedo llamar si tengo preguntas?  Cules son los mejores  momentos para controlar la glucemia? Dnde encontrar ms informacin:  Asociacin Estadounidense de la Diabetes (American Diabetes Association): diabetes.org  Academia de Nutricin y Information systems manager (Academy of Nutrition and Dietetics): www.eatright.[place] Diabetes y las Enfermedades Digestivas y [place] of Diabetes and Digestive and Kidney Diseases, NIH): [employer]eral.dk Resumen  Un plan de alimentacin saludable lo ayudar a Aeronautical engineer glucemia y Theatre manager un estilo de vida saludable.  Trabajar con un especialista en dietas y nutricin (nutricionista) puede ayudarlo a Insurance claims handler de alimentacin para usted.  Tenga en cuenta que los carbohidratos (hidratos de carbono) y el alcohol tienen efectos inmediatos en sus niveles de glucemia. Es importante contar los carbohidratos que ingiere y consumir alcohol con prudencia. Esta informacin no tiene Marine scientist el consejo del mdico. Asegrese de hacerle al mdico cualquier pregunta que tenga. Document Released: 05/31/2007 Document Revised: 11/01/2016 Document Reviewed: 06/13/2016 Elsevier Patient Education  2020 Elsevier Inc.      Agustina Caroli, MD [facility]7/03/2018: Narrative:    Nuclear stress EF: 51%. No wall motion abnormalities identified   There was no ST segment deviation noted during stress.   This is a low risk study. No perfusion defects.   The study is normal.    BP Readings from Last 3 Encounters:  05/29/18 130/72  ° ° °

## 2018-11-08 NOTE — Patient Instructions (Addendum)
Hipertensin en los adultos Hypertension, Adult El trmino hipertensin es otra forma de denominar a la presin arterial elevada. La presin arterial elevada fuerza al corazn a trabajar ms para bombear la sangre. Esto puede causar problemas con el paso del [place]. Una lectura de presin arterial est compuesta por 2 nmeros. Hay un nmero superior (sistlico) sobre un nmero inferior (diastlico). Lo ideal es tener la presin arterial por debajo de 120/80. Las elecciones saludables pueden ayudar a Engineer, materials presin arterial, o tal vez necesite medicamentos para bajarla. Cules son las causas? Se desconoce la causa de esta afeccin. Algunas afecciones pueden estar relacionadas con la presin arterial alta. Qu incrementa el riesgo?  Fumar.  Tener diabetes mellitus tipo 2, colesterol alto, o ambos.  No hacer la cantidad suficiente de actividad fsica o ejercicio.  Tener sobrepeso.  Consumir mucha grasa, azcar, caloras o sal (sodio) en su dieta.  Beber alcohol en exceso.  Tener una enfermedad renal a largo plazo (crnica).  Tener antecedentes familiares de presin arterial alta.  Edad. Los riesgos aumentan con la edad.  Raza. El riesgo es mayor para las Retail banker.  Sexo. Antes de los 45aos, los hombres corren ms [employer]. Despus de los 65aos, las mujeres corren ms [employer].  Tener apnea obstructiva del sueo.  Estrs. Cules son los signos o los sntomas?  Es posible que la presin arterial alta puede no cause sntomas. La presin arterial muy alta (crisis hipertensiva) puede provocar: ? Dolor de [place]. ? Sensaciones de preocupacin o nerviosismo (ansiedad). ? Falta de aire. ? Hemorragia nasal. ? Sensacin de Engineer, site (nuseas). ? Vmitos. ? Cambios en la forma de ver. ? Dolor muy intenso en el pecho. ? Convulsiones. Cmo se trata?  Esta afeccin se trata haciendo cambios saludables en el estilo  de vida, por ejemplo: ? Consumir alimentos saludables. ? Hacer ms ejercicio. ? Beber menos alcohol.  El mdico puede recetarle medicamentos si los cambios en el estilo de vida no son suficientes para Child psychotherapist la presin arterial y si: ? El nmero de arriba est por encima de 130. ? El nmero de abajo est por encima de 80.  Su presin arterial personal ideal puede variar. Siga estas instrucciones en su casa: Comida y bebida   Si se lo dicen, siga el plan de alimentacin de DASH (Dietary Approaches to Stop Hypertension, Maneras de alimentarse para detener la hipertensin). Para seguir este plan: ? Llene la mitad del plato de cada comida con frutas y verduras. ? Llene un cuarto del plato de cada comida con cereales integrales. Los cereales integrales incluyen pasta integral, arroz integral y pan integral. ? Coma y beba productos lcteos con bajo contenido de grasa, como leche descremada o yogur bajo en grasas. ? Llene un cuarto del plato de cada comida con protenas bajas en grasa (magras). Las protenas bajas en grasa incluyen pescado, pollo sin piel, huevos, frijoles y tofu. ? Evite consumir carne grasa, carne curada y procesada, o pollo con piel. ? Evite consumir alimentos prehechos o procesados.  Consuma menos de 1500 mg de sal por da.  No beba alcohol si: ? El mdico le indica que no lo haga. ? Est embarazada, puede estar embarazada o est tratando de quedar embarazada.  Si bebe alcohol: ? Limite la cantidad que bebe a lo siguiente:  De 0 a 1 medida por da para las mujeres.  De 0 a 2 medidas por da para los hombres. ? Est  atento a la cantidad de alcohol que hay en las bebidas que toma. En los [place], una medida equivale a una botella de cerveza de 12oz (353ml), un vaso de vino de 5oz (143ml) o un vaso de una bebida alcohlica de alta graduacin de 1oz (66ml). Estilo de vida   Trabaje con su mdico para mantenerse en un peso saludable o para perder  peso. Pregntele a su mdico cul es el peso recomendable para usted.  Haga al menos 67minutos de ejercicio la [employer] de la [place]. Estos pueden incluir caminar, nadar o andar en bicicleta.  Realice al menos 30 minutos de ejercicio que fortalezca sus msculos (ejercicios de resistencia) al menos 3 das a la [place]. Estos pueden incluir levantar pesas o hacer [place].  No consuma ningn producto que contenga nicotina o tabaco, como cigarrillos, cigarrillos electrnicos y tabaco de Higher education careers adviser. Si necesita ayuda para dejar de fumar, consulte al [employer].  Controle su presin arterial en su casa tal como le indic el mdico.  Concurra a todas las visitas de seguimiento como se lo haya indicado el mdico. Esto es importante. Medicamentos  [employer] de venta libre y los recetados solamente como se lo haya indicado el mdico. Siga cuidadosamente las indicaciones.  No omita las dosis de medicamentos para la presin arterial. Los medicamentos pierden eficacia si omite dosis. El hecho de omitir las dosis tambin [place] el riesgo de otros problemas.  Pregntele a su mdico a qu efectos secundarios o reacciones a los Careers information officer. Comunquese con un mdico si:  Piensa que tiene [place] reaccin a los medicamentos que est tomando.  Tiene dolores de cabeza frecuentes (recurrentes).  Se siente mareado.  Tiene hinchazn en los tobillos.  Tiene problemas de visin. Solicite ayuda inmediatamente si:  Siente un dolor de cabeza muy intenso.  Empieza a sentirse desorientado (confundido).  Se siente dbil o adormecido.  Siente que va a desmayarse.  Tiene un dolor muy intenso en las siguientes zonas: ? Pecho. ? Vientre (abdomen).  Vomita ms de una vez.  Tiene dificultad para respirar. Resumen  El trmino hipertensin es otra forma de denominar a la presin arterial elevada.  La presin arterial elevada fuerza al corazn a trabajar ms para bombear  la sangre.  Para la [employer], una presin arterial normal es menor que 120/80.  Las decisiones saludables pueden ayudarle a disminuir su presin arterial. Si no puede bajar su presin arterial mediante decisiones saludables, es posible que deba tomar medicamentos. Esta informacin no tiene Marine scientist el consejo del mdico. Asegrese de hacerle al mdico cualquier pregunta que tenga. Document Released: 08/11/2009 Document Revised: 12/07/2017 Document Reviewed: 12/07/2017 Elsevier Patient Education  2020 [employer].  Diabetes mellitus y nutricin, en adultos Diabetes Mellitus and Nutrition, Adult Si sufre de diabetes (diabetes mellitus), es muy importante tener hbitos alimenticios saludables debido a que sus niveles de Designer, television/film set sangre (glucosa) se ven afectados en gran medida por lo que come y bebe. [place] alimentos saludables en las cantidades [place], aproximadamente a la [employer], [place] ayudar a:  Aeronautical engineer glucemia.  Disminuir el riesgo de sufrir una enfermedad cardaca.  Mejorar la presin arterial.  Science writer o mantener un peso saludable. Todas las personas que sufren de diabetes son diferentes y cada una tiene necesidades diferentes en cuanto a un plan de alimentacin. El mdico puede recomendarle que trabaje con un especialista en dietas y nutricin (nutricionista) para Insurance claims handler  para usted. Su plan de alimentacin puede variar segn factores como:  Las caloras que necesita.  Los medicamentos que toma.  Su peso.  Sus niveles de glucemia, presin arterial y colesterol.  Su nivel de [place].  Otras afecciones que tenga, como enfermedades cardacas o renales. Cmo me afectan los carbohidratos? Los carbohidratos, o hidratos de carbono, afectan su nivel de glucemia ms que cualquier otro tipo de alimento. La ingesta de carbohidratos naturalmente aumenta la cantidad de [employer]. El recuento de  carbohidratos es un mtodo destinado a Catering manager un registro de la cantidad de carbohidratos que se consumen. El recuento de carbohidratos es importante para Theatre manager la glucemia a un nivel saludable, especialmente si utiliza insulina o toma determinados medicamentos por va oral para la diabetes. Es importante conocer la cantidad de carbohidratos que se pueden ingerir en cada comida sin correr Engineer, manufacturing. Esto es Psychologist, forensic. Su nutricionista puede ayudarlo a calcular la cantidad de carbohidratos que debe ingerir en cada comida y en cada refrigerio. Entre los alimentos que contienen carbohidratos, se incluyen:  Pan, cereal, arroz, pastas y galletas.  Papas y maz.  Guisantes, frijoles y lentejas.  Leche y Estate agent.  Lambert Mody y [place].  Postres, como pasteles, galletas, helado y caramelos. Cmo me afecta el alcohol? El alcohol puede provocar disminuciones sbitas de la glucemia (hipoglucemia), especialmente si utiliza insulina o toma determinados medicamentos por va oral para la diabetes. La hipoglucemia es una afeccin potencialmente mortal. Los sntomas de la hipoglucemia (somnolencia, mareos y confusin) son similares a los sntomas de haber consumido demasiado alcohol. Si el mdico afirma que el alcohol es seguro para usted, [place] estas pautas:  Limite el consumo de alcohol a no ms de 72medida por da si es mujer y no est [place], y a 38medidas si es hombre. Una medida equivale a 12oz (347ml) de cerveza, 5oz (157ml) de vino o 1oz (15ml) de bebidas alcohlicas de alta graduacin.  No beba con el estmago vaco.  Mantngase hidratado bebiendo agua, refrescos dietticos o t helado sin azcar.  Tenga en cuenta que los refrescos comunes, los jugos y otras bebida para Optician, dispensing pueden contener mucha azcar y se deben contar como carbohidratos. Cules son algunos consejos para seguir este plan?  Leer las etiquetas de los alimentos  Comience por leer el tamao de la  porcin en la "Informacin nutricional" en las etiquetas de los alimentos envasados y las bebidas. La cantidad de caloras, carbohidratos, grasas y otros nutrientes mencionados en la etiqueta se basan en una porcin del alimento. Muchos alimentos contienen ms de una porcin por envase.  Verifique la cantidad total de gramos (g) de carbohidratos totales en una porcin. Puede calcular la cantidad de porciones de carbohidratos al dividir el total de carbohidratos por 15. Por ejemplo, si un alimento tiene un total de 30g de carbohidratos, equivale a 2 porciones de carbohidratos.  Verifique la cantidad de gramos (g) de grasas saturadas y grasas trans en una porcin. Escoja alimentos que no contengan grasa o que tengan un bajo contenido.  Verifique la cantidad de miligramos (mg) de sal (sodio) en una porcin. La [employer] de las personas deben limitar la ingesta de sodio total a menos de 2300mg  por Training and development officer.  Siempre consulte la informacin nutricional de los alimentos etiquetados como "con bajo contenido de grasa" o "sin grasa". Estos alimentos pueden tener un mayor contenido de Location manager agregada o carbohidratos refinados, y deben evitarse.  Hable con su nutricionista para identificar sus objetivos diarios en cuanto a  los nutrientes mencionados en la etiqueta. Al ir de compras  Evite comprar alimentos procesados, enlatados o precocinados. Estos alimentos tienden a Special educational needs teacher mayor cantidad de [place], sodio y azcar agregada.  Compre en la zona exterior de la tienda de comestibles. Esta zona incluye frutas y verduras frescas, granos a granel, carnes frescas y productos lcteos frescos. Al cocinar  Utilice mtodos de coccin a baja temperatura, como hornear, en lugar de mtodos de coccin a alta temperatura, como frer en abundante aceite.  Cocine con aceites saludables, como el aceite de [place], canola o [place].  Evite cocinar con manteca, crema o carnes con alto contenido de grasa. Planificacin de las  comidas  Coma las comidas y los refrigerios regularmente, preferentemente a la misma hora todos [place]. Evite pasar largos perodos de tiempo sin comer.  Consuma alimentos ricos en fibra, como frutas frescas, verduras, frijoles y cereales integrales. Consulte a su nutricionista sobre cuntas porciones de carbohidratos puede consumir en cada comida.  Consuma entre 4 y 6 onzas (oz) de protenas magras por da, como carnes [place], pollo, pescado, huevos o tofu. Una onza de protena magra equivale a: ? 1 onza de carne, pollo o pescado. ? 1huevo. ?  taza de tofu.  Coma algunos alimentos por da que contengan grasas saludables, como aguacates, frutos secos, semillas y pescado. Estilo de vida  Controle su nivel de glucemia con regularidad.  Haga actividad fsica habitualmente como se lo haya indicado el mdico. Esto puede incluir lo siguiente: ? 153minutos semanales de ejercicio de intensidad moderada o alta. Esto podra incluir caminatas dinmicas, ciclismo o gimnasia acutica. ? Realizar ejercicios de elongacin y de fortalecimiento, como yoga o levantamiento de pesas, por lo menos 2veces por semana.  Tome los [employer] se lo haya indicado el mdico.  No consuma ningn producto que contenga nicotina o tabaco, como cigarrillos y Psychologist, sport and exercise. Si necesita ayuda para dejar de fumar, consulte al [employer] con un asesor o instructor en diabetes para identificar estrategias para controlar el estrs y cualquier desafo emocional y social. Preguntas para hacerle al mdico  Es necesario que consulte a Radio broadcast assistant en el cuidado de la diabetes?  Es necesario que me rena con un nutricionista?  A qu nmero puedo llamar si tengo preguntas?  Cules son los mejores momentos para controlar la glucemia? Dnde encontrar ms informacin:  Asociacin Estadounidense de la Diabetes (American Diabetes Association): diabetes.org  Academia de Nutricin y Information systems manager  (Academy of Nutrition and Dietetics): www.eatright.[place] Diabetes y las Enfermedades Digestivas y [facility] of Diabetes and Digestive and Kidney Diseases, NIH): [employer]inesFuneral.dk Resumen  Un plan de alimentacin saludable lo ayudar a Aeronautical engineer glucemia y Theatre manager un estilo de vida saludable.  Trabajar con un especialista en dietas y nutricin (nutricionista) puede ayudarlo a Insurance claims handler de alimentacin para usted.  Tenga en cuenta que los carbohidratos (hidratos de carbono) y el alcohol tienen efectos inmediatos en sus niveles de glucemia. Es importante contar los carbohidratos que ingiere y consumir alcohol con prudencia. Esta informacin no tiene Marine scientist el consejo del mdico. Asegrese de hacerle al mdico cualquier pregunta que tenga. Document Released: 05/31/2007 Document Revised: 11/01/2016 Document Reviewed: 06/13/2016 Elsevier Patient Education  2020 [employer].

## 2018-12-11 ENCOUNTER — Encounter: Payer: Self-pay | Admitting: Emergency Medicine

## 2019-05-08 ENCOUNTER — Ambulatory Visit: Payer: BC Managed Care – PPO | Admitting: Emergency Medicine

## 2019-05-09 ENCOUNTER — Encounter: Payer: Self-pay | Admitting: Emergency Medicine

## 2019-06-25 ENCOUNTER — Ambulatory Visit: Payer: Self-pay | Admitting: Cardiology

## 2019-09-30 ENCOUNTER — Encounter: Payer: Self-pay | Admitting: Cardiology

## 2019-09-30 ENCOUNTER — Other Ambulatory Visit: Payer: Self-pay

## 2019-09-30 ENCOUNTER — Ambulatory Visit (INDEPENDENT_AMBULATORY_CARE_PROVIDER_SITE_OTHER): Payer: Self-pay | Admitting: Cardiology

## 2019-09-30 VITALS — BP 130/80 | HR 64 | Temp 97.0°F | Ht 70.0 in | Wt 215.8 lb

## 2019-09-30 DIAGNOSIS — I10 Essential (primary) hypertension: Secondary | ICD-10-CM

## 2019-09-30 DIAGNOSIS — R9431 Abnormal electrocardiogram [ECG] [EKG]: Secondary | ICD-10-CM

## 2019-09-30 DIAGNOSIS — E785 Hyperlipidemia, unspecified: Secondary | ICD-10-CM

## 2019-09-30 DIAGNOSIS — E119 Type 2 diabetes mellitus without complications: Secondary | ICD-10-CM

## 2019-09-30 NOTE — Patient Instructions (Signed)
Medication Instructions:  Continue current medications  *If you need a refill on your cardiac medications before your next appointment, please call your pharmacy*   Lab Work: None Ordered   Testing/Procedures: None ordered   Follow-Up: At [facility], you and your health needs are our priority.  As part of our continuing mission to provide you with exceptional heart care, we have created designated Provider Care Teams.  These Care Teams include your primary Cardiologist (physician) and Advanced Practice Providers (APPs -  Physician Assistants and Nurse Practitioners) who all work together to provide you with the care you need, when you need it.  We recommend signing up for the patient portal called "MyChart".  Sign up information is provided on this After Visit Summary.  MyChart is used to connect with patients for Virtual Visits (Telemedicine).  Patients are able to view lab/test results, encounter notes, upcoming appointments, etc.  Non-urgent messages can be sent to your provider as well.   To learn more about what you can do with MyChart, go to [email or web address].    Your next appointment:   As Needed  

## 2019-09-30 NOTE — Progress Notes (Signed)
Primary Care Provider: Horald Pollen, MD Cardiologist: Glenetta Hew, MD Electrophysiologist: None  Clinic Note: Chief Complaint  Patient presents with  . Follow-up    Test results; no longer having chest pain   HPI:    Michael Mitchell is a 59 y.o. male with a PMH below who presents today for delayed annual follow-up after initial evaluation for chest discomfort and abnormal EKG.Michael Mitchell was seen on May 29, 2018 as a new patient consult for evaluation of intermittent chest discomfort and exertional dyspnea at the request of Dr. Rodena Piety -->   Was evaluated with an echocardiogram and a Myoview done last year, however follow-up was delayed due to COVID-19 scheduling.  He had to reschedule and then lost to follow-up until now.  Recent Hospitalizations: None  Reviewed  CV studies:    The following studies were reviewed today: (if available, images/films reviewed: From Epic Chart or Care Everywhere) . TTE (08/07/2018): EF 50 to 55%.  GR 1 DD.  Normal RV size and function.  Normal valves. . Myoview (09/05/2018): EF 50%.  No R WMA.  No perfusion defect.  LOW RISK.  Normal study.  Interview was carried out with the assistance of [name], CMA (native Spanish speaker, patient preferred this over the video interpreter)  Interval History:   Michael Mitchell returns here today overall feeling well.  He is stable.  No longer having any chest pain or pressure with rest or exertion.  Has off-and-on headache.  He is not noticing any dyspnea.  He says that the intermittent chest discomfort was gone by the time he had his Myoview.  He was not sure that he even needed to come back after the tests were done.  He came back because he was contacted by the office, so decided to come in just to get the results.   CV Review of Symptoms (Summary) Cardiovascular ROS: no chest pain or dyspnea on exertion negative for - edema, irregular heartbeat, loss of consciousness,  murmur, orthopnea, palpitations, paroxysmal nocturnal dyspnea, rapid heart rate or shortness of breath  The patient does not have symptoms concerning for COVID-19 infection (fever, chills, cough, or new shortness of breath).  The patient is practicing social distancing & Masking.    REVIEWED OF SYSTEMS   ROS   I have reviewed and (if needed) personally updated the patient's problem list, medications, allergies, past medical and surgical history, social and family history.   PAST MEDICAL HISTORY   Past Medical History:  Diagnosis Date  . Diabetes mellitus without complication ([place])   . Essential hypertension    On amlodipine 2.5 mg & lisinopril-HCTZ 20-25 mg daily.  . Hyperlipidemia LDL goal <100    On Lipitor 40 mg daily    PAST SURGICAL HISTORY   Past Surgical History:  Procedure Laterality Date  . None      MEDICATIONS/ALLERGIES   Current Meds  Medication Sig  . sildenafil (VIAGRA) 100 MG tablet Take 0.5-1 tablets (50-100 mg total) by mouth daily as needed for erectile dysfunction.    No Known Allergies  SOCIAL HISTORY/FAMILY HISTORY   Reviewed in Epic:  Pertinent findings: No new changes  OBJCTIVE -PE, EKG, labs   Wt Readings from Last 3 Encounters:  09/30/19 (!) 215 lb 12.8 oz (97.9 kg)  11/08/18 217 lb 9.6 oz (98.7 kg)  09/05/18 217 lb (98.4 kg)    Physical Exam: BP (!) 130/80   Pulse 64   Temp (!) 97 F (36.1 C)  Ht 5\' 10"  (1.778 m)   Wt (!) 215 lb 12.8 oz (97.9 kg)   SpO2 97%   BMI 30.96 kg/m  Physical Exam Constitutional:      General: He is not in acute distress.    Appearance: Normal appearance. He is normal weight.  HENT:     Head: Normocephalic and atraumatic.  Pulmonary:     Effort: Pulmonary effort is normal.  Musculoskeletal:        General: No swelling. Normal range of motion.     Cervical back: Normal range of motion.  Neurological:     General: No focal deficit present.     Mental Status: He is alert and oriented to  person, place, and time.  Psychiatric:        Mood and Affect: Mood normal.        Behavior: Behavior normal.        Thought Content: Thought content normal.        Judgment: Judgment normal.     Adult ECG Report  Rate: 64 ;  Rhythm: normal sinus rhythm and Stable anteroseptal ST and T wave changes with T wave inversions.  Borderline incomplete right bundle branch block versus IVCD);   Narrative Interpretation: Baseline stable EKG although it is abnormal.  Recent Labs: Labs presumably are followed by PCP, however not readily available.  His previous PCP has moved and he has had for now seeing Dr. Mitchel Honour No results found for: CHOL, HDL, LDLCALC, LDLDIRECT, TRIG, CHOLHDL Lab Results  Component Value Date   CREATININE 0.94 06/01/2007   BUN 9 06/01/2007   NA 139 06/01/2007   K 4.1 SLIGHT HEMOLYSIS 06/01/2007   CL 104 06/01/2007   CO2 27 06/01/2007   No results found for: TSH  ASSESSMENT/PLAN     Problem List Items Addressed This Visit    Diabetes mellitus type II, controlled, with no complications (HCC) (Chronic)   Abnormal electrocardiogram (Chronic)   Relevant Orders   EKG 12-Lead (Completed)   Essential hypertension - Primary (Chronic)   Relevant Orders   EKG 12-Lead (Completed)   Hyperlipidemia LDL goal <100 (Chronic)     Michael Mitchell seizure doing very well.  No longer having any chest pain or pressure.  His studies were essentially normal and low risk.  He does have hypertension and hyperlipidemia that seems to be well-controlled with current meds.  He has diabetes also monitored by PCP.  With diabetes and hypertension, recommendation for LDL would be less than 100.  His Lipitor has been renewed, but I have not seen labs checked.  At present, he does have an abnormal EKG but had a nonischemic Myoview and normal echocardiogram.  Would not recommend further evaluation unless symptoms return.   We talked about whether or not he would feel comfortable simply coming  back on an as-needed basis or whether you prefer to have annual follow-up.  He seemed to be happy with the idea of coming back on as-needed basis and follow-up with his PCP who will be managing his hypertension, hyperlipidemia, diabetes and erectile dysfunction.   ---------------------------------- COVID-19 Education: The signs and symptoms of COVID-19 were discussed with the patient and how to seek care for testing (follow up with PCP or arrange E-visit).   The importance of social distancing and COVID-19 vaccination was discussed today.  I spent a total of 12 minutes with the patient. >  50% of the time was spent in direct patient consultation.  Additional time spent with chart review  /  charting (studies, outside notes, etc): 6 Total Time: 18 min   Current medicines are reviewed at length with the patient today.  (+/- concerns) N/A  Notice: This dictation was prepared with Dragon dictation along with smaller phrase technology. Any transcriptional errors that result from this process are unintentional and may not be corrected upon review.  Patient Instructions / Medication Changes & Studies & Tests Ordered   Patient Instructions  Medication Instructions:  Continue current medications  *If you need a refill on your cardiac medications before your next appointment, please call your pharmacy*   Lab Work: None Ordered   Testing/Procedures: None ordered   Follow-Up: At [facility], you and your health needs are our priority.  As part of our continuing mission to provide you with exceptional heart care, we have created designated Provider Care Teams.  These Care Teams include your primary Cardiologist (physician) and Advanced Practice Providers (APPs -  Physician Assistants and Nurse Practitioners) who all work together to provide you with the care you need, when you need it.  We recommend signing up for the patient portal called "MyChart".  Sign up information is provided on this  After Visit Summary.  MyChart is used to connect with patients for Virtual Visits (Telemedicine).  Patients are able to view lab/test results, encounter notes, upcoming appointments, etc.  Non-urgent messages can be sent to your provider as well.   To learn more about what you can do with MyChart, go to [email or web address].    Your next appointment:   As Needed    Studies Ordered:   Orders Placed This Encounter  Procedures  . EKG 12-Lead     Glenetta Hew, M.D., M.S. Interventional Cardiologist   Pager # [phone] Phone # [phone] [address] [place], [place] [address]   Thank you for choosing Heartcare at [facility]!!

## 2019-10-02 DIAGNOSIS — E785 Hyperlipidemia, unspecified: Secondary | ICD-10-CM | POA: Insufficient documentation

## 2019-10-02 DIAGNOSIS — E1169 Type 2 diabetes mellitus with other specified complication: Secondary | ICD-10-CM | POA: Insufficient documentation

## 2019-12-26 ENCOUNTER — Other Ambulatory Visit: Payer: Self-pay | Admitting: Emergency Medicine

## 2019-12-26 DIAGNOSIS — E1165 Type 2 diabetes mellitus with hyperglycemia: Secondary | ICD-10-CM

## 2019-12-26 DIAGNOSIS — E785 Hyperlipidemia, unspecified: Secondary | ICD-10-CM

## 2019-12-26 NOTE — Telephone Encounter (Signed)
Requested medication (s) are due for refill today - yes  Requested medication (s) are on the active medication list -yes  Future visit scheduled -yes  Last refill: 06/15/19  Notes to clinic: Request RF medication fails lab protocol- no lab results for this medication  Requested Prescriptions  Pending Prescriptions Disp Refills   atorvastatin (LIPITOR) 40 MG tablet [Pharmacy Med Name: ATORVASTATIN 40MG TABLETS] 90 tablet 3    Sig: TAKE 1 TABLET(40 MG) BY MOUTH DAILY      Cardiovascular:  Antilipid - Statins Failed - 12/26/2019  1:21 PM      Failed - Total Cholesterol in normal range and within 360 days    No results found for: CHOL, POCCHOL, CHOLTOT        Failed - LDL in normal range and within 360 days    No results found for: LDLCALC, LDLC, HIRISKLDL, POCLDL, LDLDIRECT, REALLDLC, TOTLDLC        Failed - HDL in normal range and within 360 days    No results found for: HDL, POCHDL        Failed - Triglycerides in normal range and within 360 days    No results found for: TRIG, POCTRIG        Failed - Valid encounter within last 12 months    Recent Outpatient Visits           1 year ago Essential hypertension   Primary Care at [facility], Ines Bloomer, MD       Future Appointments             In 4 weeks [name] Fenton Malling, MD Primary Care at [place], [place] - Patient is not pregnant        metFORMIN (GLUCOPHAGE) 500 MG tablet [Pharmacy Med Name: METFORMIN 500MG TABLETS] 90 tablet 3    Sig: TAKE 1 TABLET(500 MG) BY MOUTH DAILY AT 6 PM      Endocrinology:  Diabetes - Biguanides Failed - 12/26/2019  1:21 PM      Failed - Cr in normal range and within 360 days    Creatinine, Ser  Date Value Ref Range Status  06/01/2007 0.94  Final          Failed - HBA1C is between 0 and 7.9 and within 180 days    No results found for: HGBA1C, LABA1C        Failed - eGFR in normal range and within 360 days    GFR calc Af Amer  Date Value Ref Range Status   06/01/2007   Final   >60        The eGFR has been calculated using the MDRD equation. This calculation has not been validated in all clinical   GFR calc non Af Amer  Date Value Ref Range Status  06/01/2007 >60  Final          Failed - Valid encounter within last 6 months    Recent Outpatient Visits           1 year ago Essential hypertension   Primary Care at [facility], Ines Bloomer, MD       Future Appointments             In 4 weeks [name] Fenton Malling, MD Primary Care at [place], [facility]                Requested Prescriptions  Pending Prescriptions Disp Refills   atorvastatin (  LIPITOR) 40 MG tablet [Pharmacy Med Name: ATORVASTATIN 40MG TABLETS] 90 tablet 3    Sig: TAKE 1 TABLET(40 MG) BY MOUTH DAILY      Cardiovascular:  Antilipid - Statins Failed - 12/26/2019  1:21 PM      Failed - Total Cholesterol in normal range and within 360 days    No results found for: CHOL, POCCHOL, CHOLTOT        Failed - LDL in normal range and within 360 days    No results found for: LDLCALC, LDLC, HIRISKLDL, POCLDL, LDLDIRECT, REALLDLC, TOTLDLC        Failed - HDL in normal range and within 360 days    No results found for: HDL, POCHDL        Failed - Triglycerides in normal range and within 360 days    No results found for: TRIG, POCTRIG        Failed - Valid encounter within last 12 months    Recent Outpatient Visits           1 year ago Essential hypertension   Primary Care at [facility], Ines Bloomer, MD       Future Appointments             In 4 weeks [name] Fenton Malling, MD Primary Care at [place], [place] - Patient is not pregnant        metFORMIN (GLUCOPHAGE) 500 MG tablet [Pharmacy Med Name: METFORMIN 500MG TABLETS] 90 tablet 3    Sig: TAKE 1 TABLET(500 MG) BY MOUTH DAILY AT 6 PM      Endocrinology:  Diabetes - Biguanides Failed - 12/26/2019  1:21 PM      Failed - Cr in normal range and within 360 days    Creatinine, Ser  Date  Value Ref Range Status  06/01/2007 0.94  Final          Failed - HBA1C is between 0 and 7.9 and within 180 days    No results found for: HGBA1C, LABA1C        Failed - eGFR in normal range and within 360 days    GFR calc Af Amer  Date Value Ref Range Status  06/01/2007   Final   >60        The eGFR has been calculated using the MDRD equation. This calculation has not been validated in all clinical   GFR calc non Af Amer  Date Value Ref Range Status  06/01/2007 >60  Final          Failed - Valid encounter within last 6 months    Recent Outpatient Visits           1 year ago Essential hypertension   Primary Care at [facility], Ines Bloomer, MD       Future Appointments             In 4 weeks [name], Fenton Malling, MD Primary Care at [place], [facility]

## 2019-12-26 NOTE — Telephone Encounter (Signed)
Please call patient to schedule appointment with labs. thanks

## 2019-12-26 NOTE — Telephone Encounter (Signed)
Called pt on  12/26/19 @3 :53 pm  LVMTCB

## 2019-12-29 ENCOUNTER — Other Ambulatory Visit: Payer: Self-pay | Admitting: Emergency Medicine

## 2019-12-29 DIAGNOSIS — I10 Essential (primary) hypertension: Secondary | ICD-10-CM

## 2019-12-29 NOTE — Telephone Encounter (Signed)
Requested medication (s) are due for refill today: yes  Requested medication (s) are on the active medication list: expired  Last refill:  12/05/18  Future visit scheduled: yes  Notes to clinic:  Prescription expired 02/06/19   Requested Prescriptions  Pending Prescriptions Disp Refills   lisinopril-hydrochlorothiazide (ZESTORETIC) 20-25 MG tablet [Pharmacy Med Name: LISINOPRIL-HCTZ 20/25MG  TABLETS] 90 tablet 3    Sig: Take 1 tablet by mouth daily.      Cardiovascular:  ACEI + Diuretic Combos Failed - 12/29/2019 10:51 AM      Failed - Na in normal range and within 180 days    Sodium  Date Value Ref Range Status  06/01/2007 139  Final          Failed - K in normal range and within 180 days    Potassium  Date Value Ref Range Status  06/01/2007 4.1 SLIGHT HEMOLYSIS  Final          Failed - Cr in normal range and within 180 days    Creatinine, Ser  Date Value Ref Range Status  06/01/2007 0.94  Final          Failed - Ca in normal range and within 180 days    Calcium  Date Value Ref Range Status  06/01/2007 9.0  Final          Failed - Valid encounter within last 6 months    Recent Outpatient Visits           1 year ago Essential hypertension   Primary Care at [facility], Ines Bloomer, MD       Future Appointments             In 3 weeks Posey Boyer, MD Primary Care at [place], [place] - Patient is not pregnant      Passed - Last BP in normal range    BP Readings from Last 1 Encounters:  09/30/19 (!) 130/80

## 2020-01-17 ENCOUNTER — Other Ambulatory Visit: Payer: Self-pay | Admitting: Emergency Medicine

## 2020-01-17 DIAGNOSIS — N529 Male erectile dysfunction, unspecified: Secondary | ICD-10-CM

## 2020-01-17 IMAGING — DX CHEST - 2 VIEW
2 series · 2 of 2 positions shown · non-contrast
Comparison: None.

CLINICAL DATA: Chest pain

EXAM:
CHEST - 2 VIEW

[dg chest 2 view (1 of 2)]
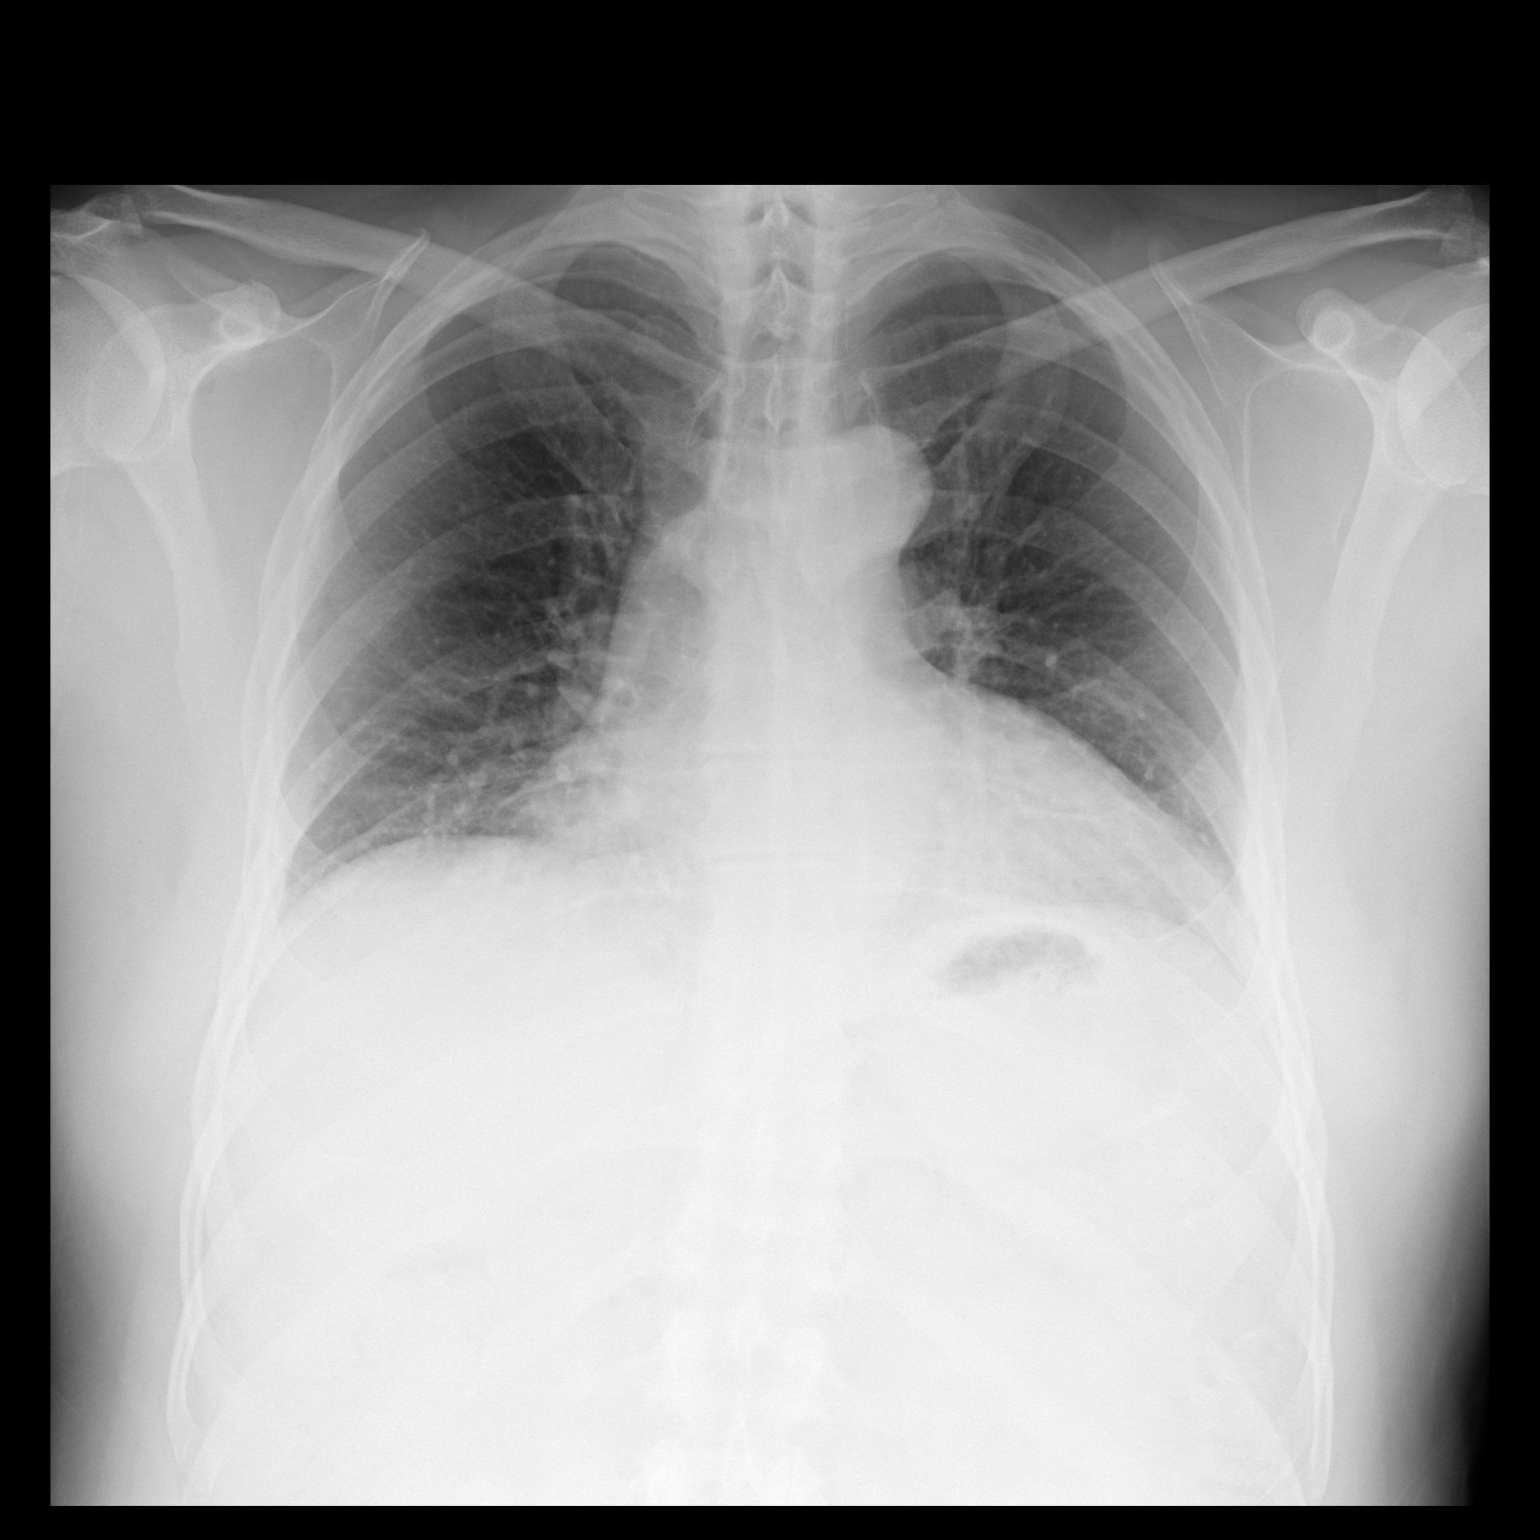

[dg chest 2 view (2 of 2)]
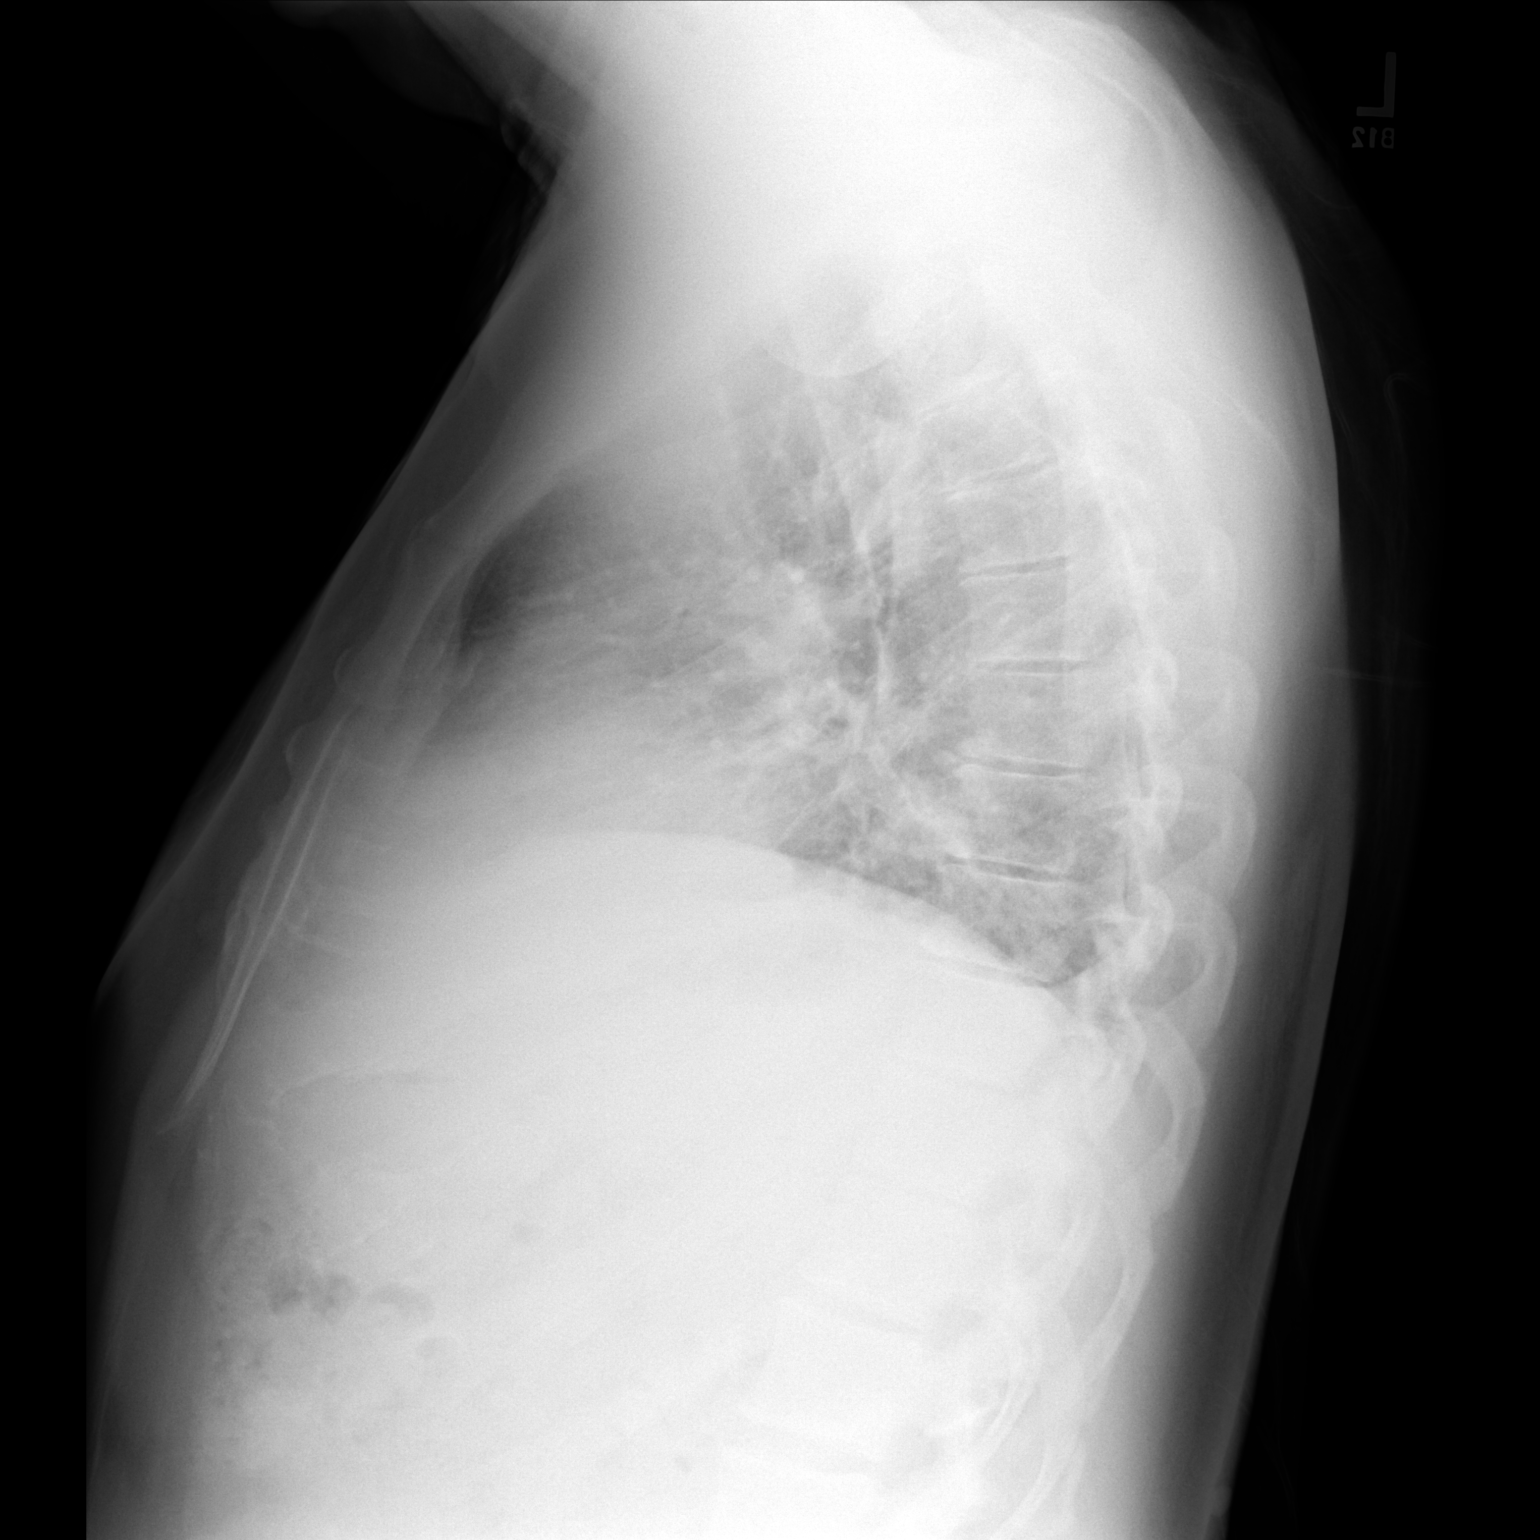

[2 of 2 positions shown; findings below may reference images not displayed]

FINDINGS: Lungs are clear. Heart is upper normal in size with pulmonary
vascularity normal. No adenopathy. No pneumothorax. No bone lesions.
IMPRESSION: No edema or consolidation.  Heart upper normal in size.

## 2020-01-23 ENCOUNTER — Encounter: Payer: Self-pay | Admitting: Family Medicine

## 2020-01-23 ENCOUNTER — Ambulatory Visit (INDEPENDENT_AMBULATORY_CARE_PROVIDER_SITE_OTHER): Payer: BC Managed Care – PPO | Admitting: Family Medicine

## 2020-01-23 ENCOUNTER — Other Ambulatory Visit: Payer: Self-pay

## 2020-01-23 VITALS — BP 136/89 | HR 66 | Temp 98.2°F | Resp 16 | Ht 70.0 in | Wt 215.8 lb

## 2020-01-23 DIAGNOSIS — K219 Gastro-esophageal reflux disease without esophagitis: Secondary | ICD-10-CM

## 2020-01-23 DIAGNOSIS — N529 Male erectile dysfunction, unspecified: Secondary | ICD-10-CM

## 2020-01-23 DIAGNOSIS — Z0000 Encounter for general adult medical examination without abnormal findings: Secondary | ICD-10-CM | POA: Diagnosis not present

## 2020-01-23 DIAGNOSIS — E785 Hyperlipidemia, unspecified: Secondary | ICD-10-CM

## 2020-01-23 DIAGNOSIS — Z23 Encounter for immunization: Secondary | ICD-10-CM

## 2020-01-23 DIAGNOSIS — E1165 Type 2 diabetes mellitus with hyperglycemia: Secondary | ICD-10-CM

## 2020-01-23 DIAGNOSIS — Z1211 Encounter for screening for malignant neoplasm of colon: Secondary | ICD-10-CM

## 2020-01-23 MED ORDER — OMEPRAZOLE 40 MG PO CPDR: 40.0000 mg | DELAYED_RELEASE_CAPSULE | Freq: Every day | ORAL | 1 refills | Status: DC

## 2020-01-23 MED ORDER — METFORMIN HCL 500 MG PO TABS: ORAL_TABLET | ORAL | 3 refills | Status: DC

## 2020-01-23 MED ORDER — ATORVASTATIN CALCIUM 40 MG PO TABS: ORAL_TABLET | ORAL | 3 refills | Status: DC

## 2020-01-23 MED ORDER — SILDENAFIL CITRATE 20 MG PO TABS: ORAL_TABLET | ORAL | 3 refills | Status: DC

## 2020-01-23 NOTE — Patient Instructions (Addendum)
  Get the Cologuard test done for colon cancer screening  Use the sildenafil 20 mg 2 or 3 pills 1 hour prior to anticipated intercourse  Continue the Metformin for diabetes.  I may change the doses when I see the results of your labs.  Continue the atorvastatin for your cholesterol.  I may change the doses when I see the results of your labs.  Get more regular exercise  Use over-the-counter diclofenac gel which you can buy at the pharmacy.  The brand name is Voltaren gel, but the generic diclofenac is the same thing.  You can use it on your hands 1-3 times a day as needed for hand pain.  If this is not helping let us know.  In addition to the diclofenac gel, you can take over-the-counter acetaminophen.you might do well to take 2 of the arthritis strength 650 mg 8-hour Tylenol before work each morning.    Continue to seek to be physically, emotionally, relationally, and spiritually healthy as discussed.  Return as needed or in 1 year.  Depending on what your laboratory results show I may ask you to come back sooner.    If you have lab work done today you will be contacted with your lab results within the next 2 weeks.  If you have not heard from [place] then please contact us. The fastest way to get your results is to register for My Chart.   IF you received an x-ray today, you will receive an invoice from [facility]. Please contact [facility] at [phone] with questions or concerns regarding your invoice.   IF you received labwork today, you will receive an invoice from [facility]. Please contact [facility] at [phone] with questions or concerns regarding your invoice.   Our billing staff will not be able to assist you with questions regarding bills from these companies.  You will be contacted with the lab results as soon as they are available. The fastest way to get your results is to activate your My Chart account. Instructions are located on the last page of this  paperwork. If you have not heard from [place] regarding the results in 2 weeks, please contact this office.

## 2020-01-23 NOTE — Progress Notes (Signed)
Patient ID: Lenzy Kerschner, male    DOB: Jan 09, 1961  Age: 59 y.o. MRN: [id number]  Chief Complaint  Patient presents with  . Annual Exam    pt here for physical today needs lab work done, no other concerns     Subjective:   Patient is here for annual physical examination.  He has no major acute complaints.  Past medical history: Operations none Hospitalizations none Allergies: None Medical illnesses: We do not have record of it, but he was started by someone on diabetes medication once a day a long time ago.  He is not certain whether it was for prediabetes or for diabetes.  He has also been on Lipitor for a time for his lipids.  He has erectile dysfunction issues and uses Viagra but has more side effects taking the 100 mg 1 so usually splits it in half.  Family history: Father died at 43 to 38 years of age and his mother is 76 does not know the details.  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  He does not do any other regular exercise.  Does not smoke or drink or use drugs.  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  He is married.  Review of systems: Constitutional: Unremarkable HEENT: Unremarkable Cardiovascular: Unremarkable Respiratory: Unremarkable GI: Unremarkable GU: Unremarkable.  He does have some irritation in his throat which sounds like he may be having some GERD at times. Musculoskeletal: Joint pains primarily in his hands Dermatologic: Unremarkable Neurologic: Unremarkable Psychiatric: Unremarkable Endocrinologic: Unremarkable.  See the notes above regarding his diabetes.   Current allergies, medications, problem list, past/family and social histories reviewed.  Objective:  BP 136/89   Pulse 66   Temp 98.2 F (36.8 C) (Temporal)   Resp 16   Ht _0  (1.778 m)   Wt 215 lb 12.8 oz (97.9 kg)   SpO2 98%   BMI 30.96 kg/m   Overweight individual in no major acute distress.  Alert and oriented.  TMs  normal.  Eyes PERRL.  EOMs intact.  Throat clear.  Neck supple without nodes or thyromegaly.  No carotid bruits.  Chest is clear to auscultation.  Heart regular without murmurs.  M soft that mass or tenderness.  Normal male external genitalia with testes descended.  No hernias.  Extremities unremarkable.  Hands look fine even though they hurt him.  Assessment & Plan:   Assessment: 1. Annual physical exam   2. Hyperlipidemia LDL goal <100   3. Type 2 diabetes mellitus with hyperglycemia, without long-term current use of insulin ([place])   4. Erectile dysfunction, unspecified erectile dysfunction type   5. Colon cancer screening       Plan: See instructions.  I forgot to give him anything for the reflux.  We will call him tomorrow regarding that and went ahead and sent in tonight.  Orders Placed This Encounter  Procedures  . Flu Vaccine QUAD 36+ mos IM  . Pneumococcal polysaccharide vaccine 23-valent greater than or equal to 2yo subcutaneous/IM  . Td : Tetanus/diphtheria >7yo Preservative  free  . Lipid panel  . CMP14+EGFR  . Hemoglobin A1c  . Microalbumin / creatinine urine ratio  . Cologuard    Meds ordered this encounter  Medications  . atorvastatin (LIPITOR) 40 MG tablet    Sig: TAKE 1 TABLET(40 MG) BY MOUTH DAILY    Dispense:  90 tablet    Refill:  3    Patient  needs to schedule an appointment for additional refills. This is a Courtesy refill.  . metFORMIN (GLUCOPHAGE) 500 MG tablet    Sig: Take 1 daily for diabetes    Dispense:  90 tablet    Refill:  3    Patient needs an appointment for refills. This is a courtesy refill.  . sildenafil (REVATIO) 20 MG tablet    Sig: Take 2 or 3 pills 1 hour prior to anticipated intercourse as needed    Dispense:  40 tablet    Refill:  3         Patient Instructions    Get the Cologuard test done for colon cancer screening  Use the sildenafil 20 mg 2 or 3 pills 1 hour prior to anticipated intercourse  Continue the Metformin  for diabetes.  I may change the doses when I see the results of your labs.  Continue the atorvastatin for your cholesterol.  I may change the doses when I see the results of your labs.  Get more regular exercise  Use over-the-counter diclofenac gel which you can buy at the pharmacy.  The brand name is Voltaren gel, but the generic diclofenac is the same thing.  You can use it on your hands 1-3 times a day as needed for hand pain.  If this is not helping let us know.  In addition to the diclofenac gel, you can take over-the-counter acetaminophen.you might do well to take 2 of the arthritis strength 650 mg 8-hour Tylenol before work each morning.    Continue to seek to be physically, emotionally, relationally, and spiritually healthy as discussed.  Return as needed or in 1 year.  Depending on what your laboratory results show I may ask you to come back sooner.    If you have lab work done today you will be contacted with your lab results within the next 2 weeks.  If you have not heard from [place] then please contact us. The fastest way to get your results is to register for My Chart.   IF you received an x-ray today, you will receive an invoice from [facility]. Please contact [facility] at [phone] with questions or concerns regarding your invoice.   IF you received labwork today, you will receive an invoice from [facility]. Please contact [facility] at [phone] with questions or concerns regarding your invoice.   Our billing staff will not be able to assist you with questions regarding bills from these companies.  You will be contacted with the lab results as soon as they are available. The fastest way to get your results is to activate your My Chart account. Instructions are located on the last page of this paperwork. If you have not heard from [place] regarding the results in 2 weeks, please contact this office.        Return in about 1 year (around  01/22/2021).   Ruben Reason, MD 01/23/2020

## 2020-01-24 ENCOUNTER — Other Ambulatory Visit: Payer: Self-pay | Admitting: Family Medicine

## 2020-01-24 DIAGNOSIS — E1165 Type 2 diabetes mellitus with hyperglycemia: Secondary | ICD-10-CM

## 2020-01-24 LAB — MICROALBUMIN / CREATININE URINE RATIO
Creatinine, Urine: 51.9 mg/dL
Microalb/Creat Ratio: <6 mg/g creat (ref 0–29)
Microalbumin, Urine: <3 ug/mL

## 2020-01-24 LAB — HEMOGLOBIN A1C
Est. average glucose Bld gHb Est-mCnc: 169 mg/dL
Hgb A1c MFr Bld: 7.5 % — ABNORMAL HIGH (ref 4.8–5.6)

## 2020-01-24 LAB — CMP14+EGFR
ALT: 41 IU/L (ref 0–44)
AST: 22 IU/L (ref 0–40)
Albumin/Globulin Ratio: 1.6 (ref 1.2–2.2)
Albumin: 4.4 g/dL (ref 3.8–4.9)
Alkaline Phosphatase: 107 IU/L (ref 44–121)
BUN/Creatinine Ratio: 18 (ref 10–24)
BUN: 17 mg/dL (ref 8–27)
Bilirubin Total: 0.8 mg/dL (ref 0.0–1.2)
CO2: 31 mmol/L — ABNORMAL HIGH (ref 20–29)
Calcium: 10.1 mg/dL (ref 8.6–10.2)
Chloride: 102 mmol/L (ref 96–106)
Creatinine, Ser: 0.93 mg/dL (ref 0.76–1.27)
GFR calc Af Amer: 103 mL/min/{1.73_m2} (ref >59)
GFR calc non Af Amer: 89 mL/min/{1.73_m2} (ref >59)
Globulin, Total: 2.8 g/dL (ref 1.5–4.5)
Glucose: 122 mg/dL — ABNORMAL HIGH (ref 65–99)
Potassium: 4.3 mmol/L (ref 3.5–5.2)
Sodium: 145 mmol/L — ABNORMAL HIGH (ref 134–144)
Total Protein: 7.2 g/dL (ref 6.0–8.5)

## 2020-01-24 LAB — LIPID PANEL
Chol/HDL Ratio: 3.2 ratio (ref 0.0–5.0)
Cholesterol, Total: 145 mg/dL (ref 100–199)
HDL: 46 mg/dL (ref >39)
LDL Chol Calc (NIH): 83 mg/dL (ref 0–99)
Triglycerides: 86 mg/dL (ref 0–149)
VLDL Cholesterol Cal: 16 mg/dL (ref 5–40)

## 2020-01-24 MED ORDER — METFORMIN HCL 500 MG PO TABS: ORAL_TABLET | ORAL | 3 refills | Status: DC

## 2020-01-24 NOTE — Progress Notes (Signed)
Call patient:The blood sugar was 122, which is only mildly elevated, but the hemoglobin A1c which tells what the average blood sugar has been doing over the last 3 months was 7.5.  In a diabetic your age I would like to see that down around 7.  Please increase the Metformin to 500 mg twice daily, one at breakfast and 1 at supper.  I am calling in a new prescription which will give you a 90-day supply at that dose level next time you pick it up.  You should come back in 3 to 4 months for recheck Michael Mitchell. [name], MD

## 2020-01-27 ENCOUNTER — Telehealth: Payer: Self-pay

## 2020-01-27 NOTE — Telephone Encounter (Signed)
LVM for pt to return call for lab results and recommendations.

## 2020-02-18 LAB — COLOGUARD
COLOGUARD: POSITIVE — AB
Cologuard: POSITIVE — AB

## 2020-02-20 ENCOUNTER — Other Ambulatory Visit: Payer: Self-pay | Admitting: Family Medicine

## 2020-02-20 ENCOUNTER — Telehealth: Payer: Self-pay

## 2020-02-20 DIAGNOSIS — R195 Other fecal abnormalities: Secondary | ICD-10-CM

## 2020-02-20 NOTE — Telephone Encounter (Signed)
LVM for a return call to inform pt of his lab results  And follow up recommendations.

## 2020-02-24 ENCOUNTER — Encounter: Payer: Self-pay | Admitting: Radiology

## 2020-03-30 ENCOUNTER — Telehealth: Payer: Self-pay | Admitting: Emergency Medicine

## 2020-03-30 NOTE — Telephone Encounter (Signed)
error 

## 2020-04-07 ENCOUNTER — Encounter: Payer: Self-pay | Admitting: Emergency Medicine

## 2020-04-07 ENCOUNTER — Other Ambulatory Visit: Payer: Self-pay

## 2020-04-07 ENCOUNTER — Telehealth: Payer: Self-pay | Admitting: *Deleted

## 2020-04-07 ENCOUNTER — Ambulatory Visit: Payer: BC Managed Care – PPO | Admitting: Emergency Medicine

## 2020-04-07 VITALS — BP 130/80 | HR 58 | Temp 98.2°F | Resp 16 | Ht 70.0 in | Wt 220.0 lb

## 2020-04-07 DIAGNOSIS — E785 Hyperlipidemia, unspecified: Secondary | ICD-10-CM

## 2020-04-07 DIAGNOSIS — R195 Other fecal abnormalities: Secondary | ICD-10-CM

## 2020-04-07 DIAGNOSIS — E1159 Type 2 diabetes mellitus with other circulatory complications: Secondary | ICD-10-CM | POA: Diagnosis not present

## 2020-04-07 DIAGNOSIS — E1169 Type 2 diabetes mellitus with other specified complication: Secondary | ICD-10-CM

## 2020-04-07 DIAGNOSIS — I152 Hypertension secondary to endocrine disorders: Secondary | ICD-10-CM | POA: Diagnosis not present

## 2020-04-07 NOTE — Progress Notes (Signed)
Michael Mitchell 60 y.o.   Chief Complaint  Patient presents with  . review labs  . positive Cologuard    Referral to GI by Dr Michael Mitchell    HISTORY OF PRESENT ILLNESS: This is a 60 y.o. male seen here by Dr. Linna Mitchell on 01/23/2020.  Here for follow-up of lab results. Also Cologuard test was positive.  Referred to [facility] for colonoscopy. Blood work showed A1c at 7.5.  Patient has a history of diabetes on Metformin but has been taking it only once a day. Also history of hypertension on Zestoretic 20-25 mg daily. History of dyslipidemia on atorvastatin 40 mg daily.  Lipid profile was normal. Has no other complaints or medical concerns today. Fully vaccinated against Covid.  HPI   Prior to Admission medications   Medication Sig Start Date End Date Taking? Authorizing Provider  atorvastatin (LIPITOR) 40 MG tablet TAKE 1 TABLET(40 MG) BY MOUTH DAILY 01/23/20  Yes Michael Boyer, MD  metFORMIN (GLUCOPHAGE) 500 MG tablet Take 1 twice daily at breakfast and supper for diabetes 01/24/20  Yes Michael Boyer, MD  omeprazole (PRILOSEC) 40 MG capsule Take 1 capsule (40 mg total) by mouth daily. 01/23/20  Yes Michael Boyer, MD  sildenafil (REVATIO) 20 MG tablet Take 2 or 3 pills 1 hour prior to anticipated intercourse as needed 01/23/20  Yes Michael Boyer, MD  lisinopril-hydrochlorothiazide (ZESTORETIC) 20-25 MG tablet TAKE 1 TABLET BY MOUTH DAILY Patient not taking: Reported on 01/23/2020 12/30/19 03/29/20  Michael Pollen, MD  sildenafil (VIAGRA) 100 MG tablet TAKE 1/2 TO 1 TABLET(50 TO 100 MG) BY MOUTH DAILY AS NEEDED FOR ERECTILE DYSFUNCTION Patient not taking: Reported on 04/07/2020 01/17/20   Michael Pollen, MD    No Known Allergies  Patient Active Problem List   Diagnosis Date Noted  . Diabetes mellitus type II, controlled, with no complications ([place]) 32/44/0102  . Abnormal electrocardiogram 05/29/2018  . Essential hypertension 05/29/2018  . Hyperlipidemia LDL goal  <100 05/29/2018    Past Medical History:  Diagnosis Date  . Diabetes mellitus without complication ([place])   . Essential hypertension    On amlodipine 2.5 mg & lisinopril-HCTZ 20-25 mg daily.  . Hyperlipidemia LDL goal <100    On Lipitor 40 mg daily    Past Surgical History:  Procedure Laterality Date  . None      Social History   Socioeconomic History  . Marital status: Married    Spouse name: Not on file  . Number of children: Not on file  . Years of education: Not on file  . Highest education level: Not on file  Occupational History  . Not on file  Tobacco Use  . Smoking status: Never Smoker  . Smokeless tobacco: Never Used  Substance and Sexual Activity  . Alcohol use: Never  . Drug use: Never  . Sexual activity: Yes  Other Topics Concern  . Not on file  Social History Narrative   Father of 2 daughters.   Social Determinants of Health   Financial Resource Strain: Not on file  Food Insecurity: Not on file  Transportation Needs: Not on file  Physical Activity: Not on file  Stress: Not on file  Social Connections: Not on file  Intimate Partner Violence: Not on file    Family History  Problem Relation Age of Onset  . Diabetes Mellitus II Mother   . Hypertension Mother   . Diabetes Mellitus II Father   . Hypertension Brother  Review of Systems  Constitutional: Negative.  Negative for chills and fever.  HENT: Negative.  Negative for congestion and sore throat.   Respiratory: Negative.  Negative for cough and shortness of breath.   Cardiovascular: Negative.  Negative for chest pain and palpitations.  Gastrointestinal: Negative.  Negative for abdominal pain, blood in stool, diarrhea, melena, nausea and vomiting.  Genitourinary: Negative.  Negative for dysuria and hematuria.  Musculoskeletal: Negative for back pain, myalgias and neck pain.  Skin: Negative.  Negative for rash.  Neurological: Negative.  Negative for dizziness and headaches.  All other  systems reviewed and are negative.   Today's Vitals   04/07/20 1347  BP: (!) 146/91  Pulse: (!) 58  Resp: 16  Temp: 98.2 F (36.8 C)  TempSrc: Temporal  SpO2: 97%  Weight: 220 lb (99.8 kg)  Height: 5\' 10"  (1.778 m)   Body mass index is 31.57 kg/m. Wt Readings from Last 3 Encounters:  04/07/20 220 lb (99.8 kg)  01/23/20 215 lb 12.8 oz (97.9 kg)  09/30/19 (!) 215 lb 12.8 oz (97.9 kg)    Physical Exam Vitals reviewed.  Constitutional:      Appearance: Normal appearance.  HENT:     Head: Normocephalic.  Eyes:     Extraocular Movements: Extraocular movements intact.     Conjunctiva/sclera: Conjunctivae normal.     Pupils: Pupils are equal, round, and reactive to light.  Cardiovascular:     Rate and Rhythm: Normal rate.  Pulmonary:     Effort: Pulmonary effort is normal.  Musculoskeletal:        General: Normal range of motion.     Cervical back: Normal range of motion.  Skin:    General: Skin is warm and dry.     Capillary Refill: Capillary refill takes less than 2 seconds.  Neurological:     General: No focal deficit present.     Mental Status: He is alert and oriented to person, place, and time.  Psychiatric:        Mood and Affect: Mood normal.        Behavior: Behavior normal.   A total of 30 minutes was spent with the patient, greater than 50% of which was in counseling/coordination of care regarding diabetes, hypertension, dyslipidemia, and cardiovascular risks associated with these conditions, review of all medications and doses changes, education on nutrition, review of most recent blood work results including hemoglobin A1c lipid profile and CMP, review of Cologuard results and need for colonoscopy, review of most recent office visit notes, prognosis, documentation, need for follow-up.    Clinically stable.  No medical concerns identified during this visit.  Diet and nutrition discussed. Continue atorvastatin 40 mg daily. Increase Metformin to 500 mg twice  a day. Continue Zestoretic 20-25 mg daily. Needs to follow-up with GI doctor for colonoscopy as scheduled next March. Follow-up here in 3 months.  ASSESSMENT & PLAN: Michael Mitchell was seen today for review labs and positive cologuard.  Diagnoses and all orders for this visit:  Hypertension associated with diabetes (HCC)  Dyslipidemia associated with type 2 diabetes mellitus (HCC)  Positive colorectal cancer screening using Cologuard test    Patient Instructions   Appointment with [facility] is on May 08, 2020 at 11:10 am to discuss the colonoscopy.    If you have lab work done today you will be contacted with your lab results within the next 2 weeks.  If you have not heard from [place] then please contact us. The fastest way to  get your results is to register for My Chart.   IF you received an x-ray today, you will receive an invoice from [facility]. Please contact [facility] at [phone] with questions or concerns regarding your invoice.   IF you received labwork today, you will receive an invoice from [facility]. Please contact [facility] at [phone] with questions or concerns regarding your invoice.   Our billing staff will not be able to assist you with questions regarding bills from these companies.  You will be contacted with the lab results as soon as they are available. The fastest way to get your results is to activate your My Chart account. Instructions are located on the last page of this paperwork. If you have not heard from [place] regarding the results in 2 weeks, please contact this office.     Mantenimiento de Teacher, English as a foreign language, Male Adoptar un estilo de vida saludable y recibir atencin preventiva son importantes para promover la salud y Musician. Consulte al mdico sobre:  El esquema adecuado para hacerse pruebas y exmenes peridicos.  Cosas que puede hacer por su cuenta para prevenir enfermedades y [place]  sano. Qu debo saber sobre la dieta, el peso y el ejercicio? Consuma una dieta saludable  Consuma una dieta que incluya muchas verduras, frutas, productos lcteos con bajo contenido de [place] y Advertising account planner.  No consuma muchos alimentos ricos en grasas slidas, azcares agregados o sodio.   Mantenga un peso saludable El ndice de masa muscular [facility]) es una medida que puede utilizarse para identificar posibles problemas de [place]. Proporciona una estimacin de la grasa corporal basndose en el peso y la altura. Su mdico puede ayudarle a Radiation protection practitioner [place] y a Scientist, forensic o Theatre manager un peso saludable. Haga ejercicio con regularidad Haga ejercicio con regularidad. Esta es una de las prcticas ms importantes que puede hacer por su salud. La mayora de los adultos deben seguir estas pautas:  Optometrist, al menos, 169minutos de actividad fsica por semana. El ejercicio debe aumentar la frecuencia cardaca y Nature conservation officer transpirar (ejercicio de intensidad moderada).  Hacer ejercicios de fortalecimiento por lo [employer] por semana. Agregue esto a su plan de ejercicio de intensidad moderada.  Pasar menos tiempo sentados. Incluso la actividad fsica ligera puede ser beneficiosa. Controle sus niveles de colesterol y lpidos en la sangre Comience a realizarse anlisis de lpidos y Research officer, trade union en la sangre a los 20aos y luego reptalos cada 5aos. Es posible que Automotive engineer los niveles de colesterol con mayor frecuencia si:  Sus niveles de lpidos y colesterol son altos.  Es mayor de 40aos.  Presenta un alto riesgo de padecer enfermedades cardacas. Qu debo saber sobre las pruebas de deteccin del cncer? Muchos tipos de cncer pueden detectarse de manera temprana y, a menudo, pueden prevenirse. Segn su historia clnica y sus antecedentes familiares, es posible que deba realizarse pruebas de deteccin del cncer en diferentes edades. Esto puede incluir pruebas de deteccin de lo  siguiente:  Surveyor, minerals.  Cncer de prstata.  Cncer de piel.  Cncer de pulmn. Qu debo saber sobre la enfermedad cardaca, la diabetes y la hipertensin arterial? Presin arterial y enfermedad cardaca  La hipertensin arterial causa enfermedades cardacas y [place] el riesgo de accidente cerebrovascular. Es ms probable que esto se manifieste en las personas que tienen lecturas de presin arterial alta, tienen ascendencia africana o tienen sobrepeso.  Hable con el mdico sobre sus valores de presin arterial deseados.  Hgase controlar la presin arterial: ?  Cada 3 a 5 aos si tiene entre 18 y 44 aos. ? Todos los aos si es mayor de [place].  Si tiene entre 14 y 31 aos y es fumador o Insurance account manager, pregntele al mdico si debe realizarse una prueba de deteccin de aneurisma artico abdominal (AAA) por nica vez. Diabetes Realcese exmenes de deteccin de la diabetes con regularidad. Este anlisis revisa el nivel de azcar en la sangre en [place]. Hgase las pruebas de deteccin:  Cada tresaos despus de los 78aos de edad si tiene un peso normal y un bajo riesgo de padecer diabetes.  Con ms frecuencia y a partir de [place] edad inferior si tiene sobrepeso o un alto riesgo de padecer diabetes. Qu debo saber sobre la prevencin de infecciones? Hepatitis B Si tiene un riesgo ms alto de contraer hepatitis B, debe someterse a un examen de deteccin de este virus. Hable con el mdico para averiguar si tiene riesgo de contraer la infeccin por hepatitis B. Hepatitis C Se recomienda un anlisis de [place] para:  Todos los que nacieron entre 1945 y [phone].  Todas las personas que tengan un riesgo de haber contrado hepatitis C. Enfermedades de transmisin sexual (ETS)  Debe realizarse pruebas de deteccin de ITS todos los aos, incluidas la gonorrea y la clamidia, si: ? Es sexualmente activo y es menor de 24aos. ? Es mayor de 24aos, y Investment banker, operational informa que corre  riesgo de tener este tipo de infecciones. ? La actividad sexual ha cambiado desde que le hicieron la ltima prueba de deteccin y tiene un riesgo mayor de Best boy clamidia o Radio broadcast assistant. Pregntele al mdico si usted tiene riesgo.  Pregntele al mdico si usted tiene un alto riesgo de Museum/gallery curator VIH. El mdico tambin puede recomendarle un medicamento recetado para ayudar a evitar la infeccin por el VIH. Si elige tomar medicamentos para prevenir el VIH, primero debe [employer] de deteccin del VIH. Luego debe hacerse anlisis cada 55meses mientras est tomando los medicamentos. Siga estas instrucciones en su casa: Estilo de vida  No consuma ningn producto que contenga nicotina o tabaco, como cigarrillos, cigarrillos electrnicos y tabaco de Higher education careers adviser. Si necesita ayuda para dejar de fumar, consulte al mdico.  No consuma drogas.  No comparta agujas.  Solicite ayuda a su mdico si necesita apoyo o informacin para abandonar las drogas. Consumo de alcohol  No beba alcohol si el mdico se lo prohbe.  Si bebe alcohol: ? Limite la cantidad que consume de 0 a 2 medidas por da. ? Est atento a la cantidad de alcohol que hay en las bebidas que toma. En los [place], una medida equivale a una botella de cerveza de 12oz (383ml), un vaso de vino de 5oz (167ml) o un vaso de una bebida alcohlica de alta graduacin de 1oz (90ml). Instrucciones generales  Realcese los estudios de rutina de la salud, dentales y de Public librarian.  [place].  Infrmele a su mdico si: ? Se siente deprimido con frecuencia. ? Alguna vez ha sido vctima de [place] o no se siente seguro en su casa. Resumen  Adoptar un estilo de vida saludable y recibir atencin preventiva son importantes para promover la salud y Musician.  Siga las instrucciones del mdico acerca de una dieta saludable, el ejercicio y la realizacin de pruebas o exmenes para Engineer, building services.  Siga las  instrucciones del mdico con respecto al control del colesterol y la presin arterial. Esta informacin no tiene Marine scientist  el consejo del mdico. Asegrese de hacerle al mdico cualquier pregunta que tenga. Document Revised: 03/14/2018 Document Reviewed: 03/14/2018 Elsevier Patient Education  2021 Elsevier Inc.       Agustina Caroli, MD [facility]

## 2020-04-07 NOTE — Telephone Encounter (Signed)
Spoke Barbara at [facility], the patient's appointment is 05/08/2020 at [facility] with Dr Rush Landmark to discuss getting a colonoscopy.

## 2020-04-07 NOTE — Patient Instructions (Addendum)
Appointment with [facility] is on May 08, 2020 at 11:10 am to discuss the colonoscopy.    If you have lab work done today you will be contacted with your lab results within the next 2 weeks.  If you have not heard from [place] then please contact us. The fastest way to get your results is to register for My Chart.   IF you received an x-ray today, you will receive an invoice from [facility]. Please contact [facility] at [phone] with questions or concerns regarding your invoice.   IF you received labwork today, you will receive an invoice from [facility]. Please contact [facility] at [phone] with questions or concerns regarding your invoice.   Our billing staff will not be able to assist you with questions regarding bills from these companies.  You will be contacted with the lab results as soon as they are available. The fastest way to get your results is to activate your My Chart account. Instructions are located on the last page of this paperwork. If you have not heard from [place] regarding the results in 2 weeks, please contact this office.     Mantenimiento de Teacher, English as a foreign language, Male Adoptar un estilo de vida saludable y recibir atencin preventiva son importantes para promover la salud y Musician. Consulte al mdico sobre:  El esquema adecuado para hacerse pruebas y exmenes peridicos.  Cosas que puede hacer por su cuenta para prevenir enfermedades y [place] sano. Qu debo saber sobre la dieta, el peso y el ejercicio? Consuma una dieta saludable  Consuma una dieta que incluya muchas verduras, frutas, productos lcteos con bajo contenido de [place] y Advertising account planner.  No consuma muchos alimentos ricos en grasas slidas, azcares agregados o sodio.   Mantenga un peso saludable El ndice de masa muscular [facility]) es una medida que puede utilizarse para identificar posibles problemas de [place]. Proporciona una estimacin de la grasa  corporal basndose en el peso y la altura. Su mdico puede ayudarle a Radiation protection practitioner [place] y a Scientist, forensic o Theatre manager un peso saludable. Haga ejercicio con regularidad Haga ejercicio con regularidad. Esta es una de las prcticas ms importantes que puede hacer por su salud. La mayora de los adultos deben seguir estas pautas:  Optometrist, al menos, 159minutos de actividad fsica por semana. El ejercicio debe aumentar la frecuencia cardaca y Nature conservation officer transpirar (ejercicio de intensidad moderada).  Hacer ejercicios de fortalecimiento por lo [employer] por semana. Agregue esto a su plan de ejercicio de intensidad moderada.  Pasar menos tiempo sentados. Incluso la actividad fsica ligera puede ser beneficiosa. Controle sus niveles de colesterol y lpidos en la sangre Comience a realizarse anlisis de lpidos y Research officer, trade union en la sangre a los 20aos y luego reptalos cada 5aos. Es posible que Automotive engineer los niveles de colesterol con mayor frecuencia si:  Sus niveles de lpidos y colesterol son altos.  Es mayor de 40aos.  Presenta un alto riesgo de padecer enfermedades cardacas. Qu debo saber sobre las pruebas de deteccin del cncer? Muchos tipos de cncer pueden detectarse de manera temprana y, a menudo, pueden prevenirse. Segn su historia clnica y sus antecedentes familiares, es posible que deba realizarse pruebas de deteccin del cncer en diferentes edades. Esto puede incluir pruebas de deteccin de lo siguiente:  Surveyor, minerals.  Cncer de prstata.  Cncer de piel.  Cncer de pulmn. Qu debo saber sobre la enfermedad cardaca, la diabetes y la hipertensin arterial? Presin arterial y enfermedad cardaca  La hipertensin arterial causa enfermedades cardacas y [place] el riesgo de accidente cerebrovascular. Es ms probable que esto se manifieste en las personas que tienen lecturas de presin arterial alta, tienen ascendencia africana o tienen sobrepeso.  Hable con  el mdico sobre sus valores de presin arterial deseados.  Hgase controlar la presin arterial: ? Cada 3 a 5 aos si tiene entre 18 y 37 aos. ? Todos los aos si es mayor de [place].  Si tiene entre 81 y 71 aos y es fumador o Insurance account manager, pregntele al mdico si debe realizarse una prueba de deteccin de aneurisma artico abdominal (AAA) por nica vez. Diabetes Realcese exmenes de deteccin de la diabetes con regularidad. Este anlisis revisa el nivel de azcar en la sangre en [place]. Hgase las pruebas de deteccin:  Cada tresaos despus de los 17aos de edad si tiene un peso normal y un bajo riesgo de padecer diabetes.  Con ms frecuencia y a partir de [place] edad inferior si tiene sobrepeso o un alto riesgo de padecer diabetes. Qu debo saber sobre la prevencin de infecciones? Hepatitis B Si tiene un riesgo ms alto de contraer hepatitis B, debe someterse a un examen de deteccin de este virus. Hable con el mdico para averiguar si tiene riesgo de contraer la infeccin por hepatitis B. Hepatitis C Se recomienda un anlisis de [place] para:  Todos los que nacieron entre 1945 y [phone].  Todas las personas que tengan un riesgo de haber contrado hepatitis C. Enfermedades de transmisin sexual (ETS)  Debe realizarse pruebas de deteccin de ITS todos los aos, incluidas la gonorrea y la clamidia, si: ? Es sexualmente activo y es menor de 24aos. ? Es mayor de 24aos, y Investment banker, operational informa que corre riesgo de tener este tipo de infecciones. ? La actividad sexual ha cambiado desde que le hicieron la ltima prueba de deteccin y tiene un riesgo mayor de Best boy clamidia o Radio broadcast assistant. Pregntele al mdico si usted tiene riesgo.  Pregntele al mdico si usted tiene un alto riesgo de Museum/gallery curator VIH. El mdico tambin puede recomendarle un medicamento recetado para ayudar a evitar la infeccin por el VIH. Si elige tomar medicamentos para prevenir el VIH, primero debe [employer] de  deteccin del VIH. Luego debe hacerse anlisis cada 70meses mientras est tomando los medicamentos. Siga estas instrucciones en su casa: Estilo de vida  No consuma ningn producto que contenga nicotina o tabaco, como cigarrillos, cigarrillos electrnicos y tabaco de Higher education careers adviser. Si necesita ayuda para dejar de fumar, consulte al mdico.  No consuma drogas.  No comparta agujas.  Solicite ayuda a su mdico si necesita apoyo o informacin para abandonar las drogas. Consumo de alcohol  No beba alcohol si el mdico se lo prohbe.  Si bebe alcohol: ? Limite la cantidad que consume de 0 a 2 medidas por da. ? Est atento a la cantidad de alcohol que hay en las bebidas que toma. En los [place], una medida equivale a una botella de cerveza de 12oz (376ml), un vaso de vino de 5oz (151ml) o un vaso de una bebida alcohlica de alta graduacin de 1oz (14ml). Instrucciones generales  Realcese los estudios de rutina de la salud, dentales y de Public librarian.  [place].  Infrmele a su mdico si: ? Se siente deprimido con frecuencia. ? Alguna vez ha sido vctima de [place] o no se siente seguro en su casa. Resumen  Adoptar un estilo de vida saludable y recibir atencin preventiva  son importantes para Theatre stage manager salud y Musician.  Siga las instrucciones del mdico acerca de una dieta saludable, el ejercicio y la realizacin de pruebas o exmenes para Engineer, building services.  Siga las instrucciones del mdico con respecto al control del colesterol y la presin arterial. Esta informacin no tiene Marine scientist el consejo del mdico. Asegrese de hacerle al mdico cualquier pregunta que tenga. Document Revised: 03/14/2018 Document Reviewed: 03/14/2018 Elsevier Patient Education  [place].

## 2020-05-08 ENCOUNTER — Ambulatory Visit: Payer: BC Managed Care – PPO | Admitting: Gastroenterology

## 2020-05-08 ENCOUNTER — Encounter: Payer: Self-pay | Admitting: Gastroenterology

## 2020-05-08 VITALS — BP 142/86 | HR 77 | Ht 69.5 in | Wt 221.0 lb

## 2020-05-08 DIAGNOSIS — R195 Other fecal abnormalities: Secondary | ICD-10-CM

## 2020-05-08 DIAGNOSIS — Z1211 Encounter for screening for malignant neoplasm of colon: Secondary | ICD-10-CM | POA: Diagnosis not present

## 2020-05-08 DIAGNOSIS — R1310 Dysphagia, unspecified: Secondary | ICD-10-CM

## 2020-05-08 DIAGNOSIS — K219 Gastro-esophageal reflux disease without esophagitis: Secondary | ICD-10-CM

## 2020-05-08 MED ORDER — SUPREP BOWEL PREP KIT 17.5-3.13-1.6 GM/177ML PO SOLN: 1.0000 | ORAL | 0 refills | Status: DC

## 2020-05-08 NOTE — Patient Instructions (Signed)
We have sent the following medications to your pharmacy for you to pick up at your convenience: Suprep   If you are age 62 or younger, your body mass index should be between 19-25. Your Body mass index is 32.17 kg/m. If this is out of the aformentioned range listed, please consider follow up with your Primary Care Provider.   Due to recent changes in healthcare laws, you may see the results of your imaging and laboratory studies on MyChart before your provider has had a chance to review them.  We understand that in some cases there may be results that are confusing or concerning to you. Not all laboratory results come back in the same time frame and the provider may be waiting for multiple results in order to interpret others.  Please give [place] 48 hours in order for your provider to thoroughly review all the results before contacting the office for clarification of your results.   You have been scheduled for an endoscopy and colonoscopy. Please follow the written instructions given to you at your visit today. Please pick up your prep supplies at the pharmacy within the next 1-3 days. If you use inhalers (even only as needed), please bring them with you on the day of your procedure.   Thank you for choosing me and [facility].  Dr. Rush Landmark

## 2020-05-13 ENCOUNTER — Encounter: Payer: Self-pay | Admitting: Gastroenterology

## 2020-05-13 DIAGNOSIS — Z1211 Encounter for screening for malignant neoplasm of colon: Secondary | ICD-10-CM | POA: Insufficient documentation

## 2020-05-13 DIAGNOSIS — K219 Gastro-esophageal reflux disease without esophagitis: Secondary | ICD-10-CM | POA: Insufficient documentation

## 2020-05-13 DIAGNOSIS — R1310 Dysphagia, unspecified: Secondary | ICD-10-CM | POA: Insufficient documentation

## 2020-05-13 DIAGNOSIS — R195 Other fecal abnormalities: Secondary | ICD-10-CM | POA: Insufficient documentation

## 2020-05-13 NOTE — Progress Notes (Signed)
[facility] VISIT   Primary Care Provider Michael Pollen, MD [place] [address] [phone]  Referring Provider Michael Pollen, MD [place],  [place] [address] [phone]  Patient Profile: Michael Mitchell is a 60 y.o. male with a pmh significant for diabetes, hypertension, GERD, family history pancreas cancer (brother-only family member), positive Cologuard.  The patient presents to the [facility] for an evaluation and management of problem(s) noted below:  Problem List 1. Positive colorectal cancer screening using Cologuard test   2. Colon cancer screening   3. Gastroesophageal reflux disease, unspecified whether esophagitis present   4. Dysphagia, unspecified type     History of Present Illness This is the patient's first visit to the outpatient [place] clinic.  The patient describes a prior colonoscopy 11 or 12 years ago done somewhere else in [place], potentially [facility].  He was told to have a 10-year follow-up.  He underwent colon cancer screening with Cologuard testing.  Cologuard testing returned positive.  He was referred to GI in January for further work-up and evaluation.  He describes no changes in his bowel habits.  He has not noticed any blood in his stools (melena/hematochezia/maroon).  During the patient does describe a history of issues of infrequent GERD but as long as he takes omeprazole then he does not feel he has any symptoms and pyrosis is well controlled.  Once every few months he will have a sensation of solid food dysphagia but has never had to regurgitate his foods.  He has never had an upper endoscopy.  GI Review of Systems Positive as above Negative for odynophagia, nausea, vomiting, pain, bloating  Review of Systems General: Denies fevers/chills/weight loss unintentionally Cardiovascular: Denies chest pain/palpitations Pulmonary: Denies shortness of  breath/nocturnal cough Gastroenterological: See HPI Genitourinary: Denies darkened urine or hematuria Hematological: Denies easy bruising/bleeding Dermatological: Denies jaundice Psychological: Mood is stable   Medications Current Outpatient Medications  Medication Sig Dispense Refill  . atorvastatin (LIPITOR) 40 MG tablet TAKE 1 TABLET(40 MG) BY MOUTH DAILY 90 tablet 3  . lisinopril-hydrochlorothiazide (ZESTORETIC) 20-25 MG tablet Take 1 tablet by mouth daily.    . metFORMIN (GLUCOPHAGE) 500 MG tablet Take 1 twice daily at breakfast and supper for diabetes 180 tablet 3  . Na Sulfate-K Sulfate-Mg Sulf (SUPREP BOWEL PREP KIT) 17.5-3.13-1.6 GM/177ML SOLN Take 1 kit by mouth as directed. For colonoscopy prep 354 mL 0  . sildenafil (REVATIO) 20 MG tablet Take 2 or 3 pills 1 hour prior to anticipated intercourse as needed 40 tablet 3  . sildenafil (VIAGRA) 100 MG tablet TAKE 1/2 TO 1 TABLET(50 TO 100 MG) BY MOUTH DAILY AS NEEDED FOR ERECTILE DYSFUNCTION 10 tablet 0  . omeprazole (PRILOSEC) 40 MG capsule Take 1 capsule (40 mg total) by mouth daily. (Patient not taking: Reported on 05/08/2020) 30 capsule 1   No current facility-administered medications for this visit.    Allergies No Known Allergies  Histories Past Medical History:  Diagnosis Date  . Diabetes mellitus without complication ([place])   . Essential hypertension    On amlodipine 2.5 mg & lisinopril-HCTZ 20-25 mg daily.  . Hyperlipidemia LDL goal <100    On Lipitor 40 mg daily   Past Surgical History:  Procedure Laterality Date  . None     Social History   Socioeconomic History  . Marital status: Married    Spouse name: Not on file  . Number of children: Not on file  . Years  of education: Not on file  . Highest education level: Not on file  Occupational History  . Not on file  Tobacco Use  . Smoking status: Never Smoker  . Smokeless tobacco: Never Used  Substance and Sexual Activity  . Alcohol use: Never  . Drug  use: Never  . Sexual activity: Yes  Other Topics Concern  . Not on file  Social History Narrative   Father of 2 daughters.   Social Determinants of Health   Financial Resource Strain: Not on file  Food Insecurity: Not on file  Transportation Needs: Not on file  Physical Activity: Not on file  Stress: Not on file  Social Connections: Not on file  Intimate Partner Violence: Not on file   Family History  Problem Relation Age of Onset  . Diabetes Mellitus II Mother   . Hypertension Mother   . Diabetes Mother   . Diabetes Mellitus II Father   . Hypertension Brother   . Pancreatic cancer Brother   . Colon cancer Neg Hx   . Esophageal cancer Neg Hx   . Inflammatory bowel disease Neg Hx   . Liver disease Neg Hx   . Rectal cancer Neg Hx   . Stomach cancer Neg Hx    I have reviewed his medical, social, and family history in detail and updated the electronic medical record as necessary.    PHYSICAL EXAMINATION  BP (!) 142/86   Pulse 77   Ht 5' 9.5" (1.765 m)   Wt 221 lb (100.2 kg)   BMI 32.17 kg/m  Wt Readings from Last 3 Encounters:  05/08/20 221 lb (100.2 kg)  04/07/20 220 lb (99.8 kg)  01/23/20 215 lb 12.8 oz (97.9 kg)  GEN: NAD, appears stated age, doesn't appear chronically ill PSYCH: Cooperative, without pressured speech EYE: Conjunctivae pink, sclerae anicteric ENT: Masked CV: RR without R/Gs  RESP: CTAB posteriorly, without wheezing GI: NABS, soft, NT/ND, without rebound or guarding, no HSM appreciated MSK/EXT: No lower extremity edema SKIN: No jaundice NEURO:  Alert & Oriented x 3, no focal deficits   REVIEW OF DATA  I reviewed the following data at the time of this encounter:  GI Procedures and Studies  Reports a prior colonoscopy 11 or 12 years ago potentially done at [facility] we will try to obtain records  Laboratory Studies  Reviewed those in epic  Imaging Studies  No relevant studies to review   ASSESSMENT  [name] is a 60 y.o. male with  a pmh significant for diabetes, hypertension, GERD, family history pancreas cancer (brother-only family member), positive Cologuard.  The patient is seen today for evaluation and management of:  1. Positive colorectal cancer screening using Cologuard test   2. Colon cancer screening   3. Gastroesophageal reflux disease, unspecified whether esophagitis present   4. Dysphagia, unspecified type    The patient is hemodynamically and clinically stable.  He has a positive Cologuard without any other major symptoms in regards to changes in bowel habits or blood in the stools or symptoms (pain/nausea/vomiting).  Patient had a prior colonoscopy within the last 12 years and was reported to be normal.  Needs colonoscopy for next steps in evaluation.  Upon discussion with patient does have some symptoms of GERD that have been well controlled on PPI and has had very infrequent dysphagia.  We will plan to proceed with a diagnostic endoscopy for Barrett's esophagus screening due to his long length of time having been on PPI therapy.  This will  be performed at the same time as colonoscopy.  The risks and benefits of endoscopic evaluation were discussed with the patient; these include but are not limited to the risk of perforation, infection, bleeding, missed lesions, lack of diagnosis, severe illness requiring hospitalization, as well as anesthesia and sedation related illnesses.  The patient is agreeable to proceed.  All patient questions were answered to the best of my ability, and the patient agrees to the aforementioned plan of action with follow-up as indicated.   PLAN  Proceed with scheduling EGD/colonoscopy Continue current PPI dosing Follow-up to be dictated based on findings after procedure has been completed   Orders Placed This Encounter  Procedures  . Ambulatory referral to Gastroenterology    New Prescriptions   NA SULFATE-K SULFATE-MG SULF (SUPREP BOWEL PREP KIT) 17.5-3.13-1.6 GM/177ML SOLN     Take 1 kit by mouth as directed. For colonoscopy prep   Modified Medications   No medications on file    Planned Follow Up No follow-ups on file.   Total Time in Face-to-Face and in Coordination of Care for patient including independent/personal interpretation/review of prior testing, medical history, examination, medication adjustment, communicating results with the patient directly, and documentation with the EHR is 35 minutes.   Gabriel Mansouraty, MD [facility] Advanced [facility]oscopy Office # [phone]  

## 2020-06-25 ENCOUNTER — Ambulatory Visit (AMBULATORY_SURGERY_CENTER): Payer: BC Managed Care – PPO | Admitting: Gastroenterology

## 2020-06-25 ENCOUNTER — Other Ambulatory Visit: Payer: Self-pay

## 2020-06-25 ENCOUNTER — Encounter: Payer: Self-pay | Admitting: Gastroenterology

## 2020-06-25 VITALS — BP 120/88 | HR 62 | Temp 98.0°F | Resp 19 | Ht 69.0 in | Wt 221.0 lb

## 2020-06-25 DIAGNOSIS — K449 Diaphragmatic hernia without obstruction or gangrene: Secondary | ICD-10-CM | POA: Diagnosis not present

## 2020-06-25 DIAGNOSIS — K219 Gastro-esophageal reflux disease without esophagitis: Secondary | ICD-10-CM

## 2020-06-25 DIAGNOSIS — D123 Benign neoplasm of transverse colon: Secondary | ICD-10-CM

## 2020-06-25 DIAGNOSIS — D127 Benign neoplasm of rectosigmoid junction: Secondary | ICD-10-CM | POA: Diagnosis not present

## 2020-06-25 DIAGNOSIS — R1310 Dysphagia, unspecified: Secondary | ICD-10-CM

## 2020-06-25 DIAGNOSIS — K641 Second degree hemorrhoids: Secondary | ICD-10-CM

## 2020-06-25 DIAGNOSIS — K5730 Diverticulosis of large intestine without perforation or abscess without bleeding: Secondary | ICD-10-CM

## 2020-06-25 DIAGNOSIS — R195 Other fecal abnormalities: Secondary | ICD-10-CM

## 2020-06-25 DIAGNOSIS — D122 Benign neoplasm of ascending colon: Secondary | ICD-10-CM

## 2020-06-25 DIAGNOSIS — K200 Eosinophilic esophagitis: Secondary | ICD-10-CM | POA: Diagnosis not present

## 2020-06-25 MED ORDER — SODIUM CHLORIDE 0.9 % IV SOLN: 500.0000 mL | Freq: Once | INTRAVENOUS | Status: AC

## 2020-06-25 MED ORDER — OMEPRAZOLE 40 MG PO CPDR: 40.0000 mg | DELAYED_RELEASE_CAPSULE | Freq: Two times a day (BID) | ORAL | 3 refills | Status: AC

## 2020-06-25 NOTE — Progress Notes (Signed)
Called to room to assist during endoscopic procedure.  Patient ID and intended procedure confirmed with present staff. Received instructions for my participation in the procedure from the performing physician.  

## 2020-06-25 NOTE — Patient Instructions (Signed)
Information on polyps, hemorrhoids, diverticulosis and esophagitis given to you today.  Await pathology results.  Increase Omeprazole to 40 mg twice a day for the next few months to heal stomach ulcers and gastritis.  Repeat upper endoscopy in 4 months. Repeat colonoscopy in 1 year.  Dilation diet today - clear liquids until 5:50pm today.  Soft diet the rest of today.  Regular diet tomorrow.  Use fibercon tablets 1-2 tablets each day to boost your fiber intake.  USTED TUVO UN PROCEDIMIENTO ENDOSCPICO HOY EN EL  ENDOSCOPY CENTER:   Lea el informe del procedimiento que se le entreg para cualquier pregunta especfica sobre lo que se Primary school teacher.  Si el informe del examen no responde a sus preguntas, por favor llame a su gastroenterlogo para aclararlo.  Si usted solicit que no se le den [employer] de lo que se Estate manager/land agent en su procedimiento al [employer] va a cuidar, entonces el informe del procedimiento se ha incluido en un sobre sellado para que usted lo revise despus cuando le sea ms conveniente.   LO QUE PUEDE ESPERAR: Algunas sensaciones de hinchazn en el abdomen.  Puede tener ms gases de lo normal.  El caminar puede ayudarle a eliminar el aire que se le puso en el tracto gastrointestinal durante el procedimiento y reducir la hinchazn.  Si le hicieron una endoscopia inferior (como una colonoscopia o una sigmoidoscopia flexible), podra notar manchas de sangre en las heces fecales o en el papel higinico.  Si se someti a una preparacin intestinal para su procedimiento, es posible que no tenga una evacuacin intestinal normal durante [employer].   Tenga en cuenta:  Es posible que note un poco de irritacin y congestin en la nariz o algn drenaje.  Esto es debido al oxgeno [employer] durante su procedimiento.  No hay que preocuparse y esto debe desaparecer ms o Scientist, research (medical).   SNTOMAS PARA REPORTAR INMEDIATAMENTE:  Despus de una endoscopia inferior  (colonoscopia o sigmoidoscopia flexible):  Cantidades excesivas de sangre en las heces fecales  Sensibilidad significativa o empeoramiento de los dolores abdominales   Hinchazn aguda del abdomen que antes no tena   Fiebre de 100F o ms   Despus de la endoscopia superior (EGD)  Vmitos de Biochemist, clinical o material como caf molido   Dolor en el pecho o dolor debajo de los omplatos que antes no tena   Dolor o dificultad persistente para tragar  Falta de aire que antes no tena   Fiebre de 100F o ms  Heces fecales negras y pegajosas   Para asuntos urgentes o de Freight forwarder, puede comunicarse con un gastroenterlogo a cualquier hora llamando al [phone].  DIETA:  Recomendamos una comida pequea al principio, pero luego puede continuar con su dieta normal.  Tome muchos lquidos, Teacher, adult education las bebidas alcohlicas durante 24 horas.    ACTIVIDAD:  Debe planear tomarse las cosas con calma por el resto del da y no debe CONDUCIR ni usar maquinaria pesada Programmer, applications (debido a los medicamentos de sedacin utilizados durante el examen).     SEGUIMIENTO: Nuestro personal llamar al nmero que aparece en su historial al siguiente da hbil de su procedimiento para ver cmo se siente y para responder cualquier pregunta o inquietud que pueda tener con respecto a la informacin que se le dio despus del procedimiento. Si no podemos contactarle, le dejaremos un mensaje.  Sin embargo, si se siente bien y no tiene ningn problema, no es necesario que  nos devuelva la llamada.  Asumiremos que ha regresado a sus actividades diarias normales sin incidentes. Si se le tomaron algunas biopsias, le contactaremos por telfono o por carta en las prximas 3 semanas.  Si no ha sabido [employer] biopsias en el transcurso de 3 semanas, por favor llmenos al [phone].   FIRMAS/CONFIDENCIALIDAD: Usted y/o el acompaante que le cuide han firmado documentos que se ingresarn en su historial mdico  electrnico.  Estas firmas atestiguan el hecho de que la informacin anterior

## 2020-06-25 NOTE — Progress Notes (Signed)
VS- [employer] used today at the [employer] for this pt.  Interpreter's name is-Karina

## 2020-06-25 NOTE — Op Note (Signed)
[facility] Patient Name: Michael Mitchell Procedure Date: 06/25/2020 3:02 PM MRN: [id number] Endoscopist: Justice Britain , MD Age: 60 Referring MD:  Date of Birth: Jun 12, 1960 Gender: Male Account #: [id number] Procedure:                Upper GI endoscopy Indications:              Dysphagia (infrequent to solid foods), Heartburn,                            Gastro-esophageal reflux disease, Screening for                            Barrett's esophagus in patient at risk for this                            condition Medicines:                Monitored Anesthesia Care Procedure:                Pre-Anesthesia Assessment:                           - Prior to the procedure, a History and Physical                            was performed, and patient medications and                            allergies were reviewed. The patient's tolerance of                            previous anesthesia was also reviewed. The risks                            and benefits of the procedure and the sedation                            options and risks were discussed with the patient.                            All questions were answered, and informed consent                            was obtained. Prior Anticoagulants: The patient has                            taken no previous anticoagulant or antiplatelet                            agents. ASA Grade Assessment: II - A patient with                            mild systemic disease. After reviewing the risks  and benefits, the patient was deemed in                            satisfactory condition to undergo the procedure.                           After obtaining informed consent, the endoscope was                            passed under direct vision. Throughout the                            procedure, the patient's blood pressure, pulse, and                            oxygen saturations were monitored continuously.  The                            Endoscope was introduced through the mouth, and                            advanced to the second part of duodenum. The upper                            GI endoscopy was accomplished without difficulty.                            The patient tolerated the procedure. Scope In: Scope Out: Findings:                 No gross lesions were noted in the entire                            esophagus. Biopsies were taken with a cold forceps                            for histology for EoE/LoE rule out. After the rest                            of the EGD was complete, a guidewire was placed and                            the scope was withdrawn. Dilation was performed                            with a Savary dilator with mild resistance at 18                            mm. The dilation site was examined following                            endoscope reinsertion and showed no change.  The Z-line was irregular and was found 38 cm from                            the incisors.                           A 1 cm hiatal hernia was present.                           Three non-bleeding linear gastric ulcers with a                            clean ulcer base (Forrest Class III) were found in                            the gastric fundus. The largest lesion was 7 mm in                            largest dimension.                           Patchy moderately erythematous mucosa without                            bleeding was found in the gastric body and in the                            gastric antrum.                           No other gross lesions were noted in the entire                            examined stomach. Biopsies were taken with a cold                            forceps for histology and Helicobacter pylori                            testing.                           No gross lesions were noted in the duodenal bulb,                             in the first portion of the duodenum and in the                            second portion of the duodenum. Complications:            No immediate complications. Estimated Blood Loss:     Estimated blood loss was minimal. Impression:               - No gross lesions in esophagus. Biopsied. Dilated.                           -  Z-line irregular, 38 cm from the incisors.                           - 1 cm hiatal hernia.                           - Non-bleeding linear gastric ulcers with a clean                            ulcer base (Forrest Class III) in the fundus                           - Erythematous mucosa in the gastric body and                            antrum.                           - No other gross lesions in the stomach. Biopsied.                           - No gross lesions in the duodenal bulb, in the                            first portion of the duodenum and in the second                            portion of the duodenum. Recommendation:           - Proceed to scheduled colonoscopy.                           - Dilation diet as per protocol.                           - Observe patient's clinical course.                           - Await pathology results.                           - Increase to Omeprazole 40 mg twice daily for next                            few months to heal stomach ulcers and gastritis.                           - Repeat EGD in 24-months to ensure healing of                            gastric ulcers.                           - If symptoms of dysphagia recur more frequently,  then proceed with Manometry for further evaluation.                           - The findings and recommendations were discussed                            with the patient.                           - The findings and recommendations were discussed                            with the patient's family. Justice Britain, MD 06/25/2020 3:58:21 PM

## 2020-06-25 NOTE — Op Note (Signed)
[facility] Patient Name: Michael Mitchell Procedure Date: 06/25/2020 3:01 PM MRN: [id number] Endoscopist: Justice Britain , MD Age: 60 Referring MD:  Date of Birth: 26-Apr-1960 Gender: Male Account #: [id number] Procedure:                Colonoscopy Indications:              Positive Cologuard test Medicines:                Monitored Anesthesia Care Procedure:                Pre-Anesthesia Assessment:                           - Prior to the procedure, a History and Physical                            was performed, and patient medications and                            allergies were reviewed. The patient's tolerance of                            previous anesthesia was also reviewed. The risks                            and benefits of the procedure and the sedation                            options and risks were discussed with the patient.                            All questions were answered, and informed consent                            was obtained. Prior Anticoagulants: The patient has                            taken no previous anticoagulant or antiplatelet                            agents. ASA Grade Assessment: II - A patient with                            mild systemic disease. After reviewing the risks                            and benefits, the patient was deemed in                            satisfactory condition to undergo the procedure.                           After obtaining informed consent, the colonoscope  was passed under direct vision. Throughout the                            procedure, the patient's blood pressure, pulse, and                            oxygen saturations were monitored continuously. The                            Olympus CF-HQ190 (#[id number]) Colonoscope was                            introduced through the anus and advanced to the 5                            cm into the ileum. The colonoscopy was  performed                            without difficulty. The patient tolerated the                            procedure. The quality of the bowel preparation was                            good. The terminal ileum, ileocecal valve,                            appendiceal orifice, and rectum were photographed. Scope In: 3:24:01 PM Scope Out: 3:44:18 PM Scope Withdrawal Time: 0 hours 18 minutes 3 seconds  Total Procedure Duration: 0 hours 20 minutes 17 seconds  Findings:                 The digital rectal exam findings include                            hemorrhoids. Pertinent negatives include no                            palpable rectal lesions.                           The terminal ileum and ileocecal valve appeared                            normal.                           Multiple small-mouthed diverticula were found in                            the cecum.                           11, sessile polyps were found in the recto-sigmoid  colon, transverse colon and ascending colon. The                            polyps were 2 to 6 mm in size. These polyps were                            removed with a cold snare. Resection and retrieval                            were complete.                           Normal mucosa was found in the entire colon                            otherwise.                           Non-bleeding non-thrombosed external and internal                            hemorrhoids were found during retroflexion, during                            perianal exam and during digital exam. The                            hemorrhoids were Grade II (internal hemorrhoids                            that prolapse but reduce spontaneously). Complications:            No immediate complications. Estimated Blood Loss:     Estimated blood loss was minimal. Impression:               - Hemorrhoids found on digital rectal exam.                           - The  examined portion of the ileum was normal.                           - Diverticulosis in the cecum.                           - 11, 2 to 6 mm polyps at the recto-sigmoid colon,                            in the transverse colon and in the ascending colon,                            removed with a cold snare. Resected and retrieved.                           - Normal mucosa in the entire examined colon  otherwise.                           - Non-bleeding non-thrombosed external and internal                            hemorrhoids. Recommendation:           - The patient will be observed post-procedure,                            until all discharge criteria are met.                           - Discharge patient to home.                           - Patient has a contact number available for                            emergencies. The signs and symptoms of potential                            delayed complications were discussed with the                            patient. Return to normal activities tomorrow.                            Written discharge instructions were provided to the                            patient.                           - High fiber diet.                           - Use FiberCon 1-2 tablets PO daily.                           - Continue present medications.                           - Await pathology results.                           - Repeat colonoscopy in 1 year for surveillance of                            multiple polyps as long as 10 polyp return                            adenomatous.                           - The findings and recommendations were discussed  with the patient.                           - The findings and recommendations were discussed                            with the patient's family. Justice Britain, MD 06/25/2020 4:03:08 PM

## 2020-06-25 NOTE — Progress Notes (Signed)
To PACU, VSS. Report to RN.tb 

## 2020-06-25 NOTE — Progress Notes (Signed)
Medical history reviewed using interpreter. VS assessed by C.W

## 2020-06-29 ENCOUNTER — Telehealth: Payer: Self-pay | Admitting: *Deleted

## 2020-06-29 NOTE — Telephone Encounter (Signed)
  Follow up Call-  Call back number 06/25/2020  Post procedure Call Back phone  # [phone]  Permission to leave phone message Yes  Some recent data might be hidden     Patient questions:  Do you have a fever, pain , or abdominal swelling? No. Pain Score  0 *  Have you tolerated food without any problems? Yes.    Have you been able to return to your normal activities? Yes.    Do you have any questions about your discharge instructions: Diet   No. Medications  No. Follow up visit  No.  Do you have questions or concerns about your Care? No.  Actions: * If pain score is 4 or above: 1. No action needed, pain <4.Have you developed a fever since your procedure? no  2.   Have you had an respiratory symptoms (SOB or cough) since your procedure? no  3.   Have you tested positive for COVID 19 since your procedure no  4.   Have you had any family members/close contacts diagnosed with the COVID 19 since your procedure?  no   If yes to any of these questions please route to Joylene John, RN and Joella Prince, RN

## 2020-07-09 ENCOUNTER — Other Ambulatory Visit: Payer: Self-pay

## 2020-07-09 ENCOUNTER — Ambulatory Visit: Payer: Self-pay | Admitting: Emergency Medicine

## 2020-07-09 ENCOUNTER — Ambulatory Visit: Payer: BC Managed Care – PPO | Admitting: Emergency Medicine

## 2020-07-09 ENCOUNTER — Encounter: Payer: Self-pay | Admitting: Emergency Medicine

## 2020-07-09 VITALS — BP 130/80 | HR 64 | Temp 98.5°F | Ht 70.0 in | Wt 219.6 lb

## 2020-07-09 DIAGNOSIS — E1169 Type 2 diabetes mellitus with other specified complication: Secondary | ICD-10-CM

## 2020-07-09 DIAGNOSIS — I10 Essential (primary) hypertension: Secondary | ICD-10-CM | POA: Diagnosis not present

## 2020-07-09 DIAGNOSIS — K5790 Diverticulosis of intestine, part unspecified, without perforation or abscess without bleeding: Secondary | ICD-10-CM | POA: Diagnosis not present

## 2020-07-09 DIAGNOSIS — E1159 Type 2 diabetes mellitus with other circulatory complications: Secondary | ICD-10-CM | POA: Diagnosis not present

## 2020-07-09 DIAGNOSIS — I152 Hypertension secondary to endocrine disorders: Secondary | ICD-10-CM | POA: Diagnosis not present

## 2020-07-09 DIAGNOSIS — K449 Diaphragmatic hernia without obstruction or gangrene: Secondary | ICD-10-CM

## 2020-07-09 DIAGNOSIS — D125 Benign neoplasm of sigmoid colon: Secondary | ICD-10-CM

## 2020-07-09 DIAGNOSIS — K259 Gastric ulcer, unspecified as acute or chronic, without hemorrhage or perforation: Secondary | ICD-10-CM

## 2020-07-09 DIAGNOSIS — E785 Hyperlipidemia, unspecified: Secondary | ICD-10-CM

## 2020-07-09 LAB — POCT GLYCOSYLATED HEMOGLOBIN (HGB A1C): Hemoglobin A1C: 7 % — AB (ref 4.0–5.6)

## 2020-07-09 NOTE — Assessment & Plan Note (Signed)
Well-controlled hypertension.  Continue Zestoretic 20-25 mg daily. Diet and nutrition discussed. Follow-up in 6 months.

## 2020-07-09 NOTE — Patient Instructions (Signed)
Diabetes mellitus y nutricin, en adultos Diabetes Mellitus and Nutrition, Adult Si sufre de diabetes, o diabetes mellitus, es muy importante tener hbitos alimenticios saludables debido a que sus niveles de Designer, television/film set sangre (glucosa) se ven afectados en gran medida por lo que come y bebe. [place] alimentos saludables en las cantidades correctas, aproximadamente a la misma hora todos los [place], [place] ayudar a:  Aeronautical engineer glucemia.  Disminuir el riesgo de sufrir una enfermedad cardaca.  Mejorar la presin arterial.  Science writer o mantener un peso saludable. Qu puede afectar mi plan de alimentacin? Todas las personas que sufren de diabetes son diferentes y cada una tiene necesidades diferentes en cuanto a un plan de alimentacin. El mdico puede recomendarle que trabaje con un nutricionista para elaborar el mejor plan para usted. Su plan de alimentacin puede variar segn factores como:  Las caloras que necesita.  Los medicamentos que toma.  Su peso.  Sus niveles de glucemia, presin arterial y colesterol.  Su nivel de [place].  Otras afecciones que tenga, como enfermedades cardacas o renales. Cmo me afectan los carbohidratos? Los carbohidratos, o hidratos de carbono, afectan su nivel de glucemia ms que cualquier otro tipo de alimento. La ingesta de carbohidratos naturalmente aumenta la cantidad de [employer]. El recuento de carbohidratos es un mtodo destinado a Catering manager un registro de la cantidad de carbohidratos que se consumen. El recuento de carbohidratos es importante para Theatre manager la glucemia a un nivel saludable, especialmente si utiliza insulina o toma determinados medicamentos por va oral para la diabetes. Es importante conocer la cantidad de carbohidratos que se pueden ingerir en cada comida sin correr Engineer, manufacturing. Esto es Psychologist, forensic. Su nutricionista puede ayudarlo a calcular la cantidad de carbohidratos que debe ingerir en cada comida y en cada  refrigerio. Cmo me afecta el alcohol? El alcohol puede provocar disminuciones sbitas de la glucemia (hipoglucemia), especialmente si utiliza insulina o toma determinados medicamentos por va oral para la diabetes. La hipoglucemia es una afeccin potencialmente mortal. Los sntomas de la hipoglucemia, como somnolencia, mareos y confusin, son similares a los sntomas de haber consumido demasiado alcohol.  No beba alcohol si: ? Su mdico le indica no hacerlo. ? Est embarazada, puede estar embarazada o est tratando de quedar embarazada.  Si bebe alcohol: ? No beba con el estmago vaco. ? Limite la cantidad que bebe:  De 0 a 1 medida por da para las mujeres.  De 0 a 2 medidas por da para los hombres. ? Est atento a la cantidad de alcohol que hay en las bebidas que toma. En los [place], una medida equivale a una botella de cerveza de 12oz (368m), un vaso de vino de 5oz (1468m o un vaso de una bebida alcohlica de alta graduacin de 1oz (4485m ? Mantngase hidratado bebiendo agua, refrescos dietticos o t helado sin azcar.  Tenga en cuenta que los refrescos comunes, los jugos y otras bebida para mezOptician, dispensingeden contener mucha azcar y se deben contar como carbohidratos. Consejos para seguir estPhotographers etiquetas de los alimentos  Comience por leer el tamao de la porcin en la "Informacin nutricional" en las etiquetas de los alimentos envasados y las bebidas. La cantidad de caloras, carbohidratos, grasas y otros nutrientes mencionados en la etiqueta se basan en una porcin del alimento. Muchos alimentos contienen ms de una porcin por envase.  Verifique la cantidad total de gramos (g) de carbohidratos totales en una porcin. Puede calcular la cantidad de porciones  de carbohidratos al dividir el total de carbohidratos por 15. Por ejemplo, si un alimento tiene un total de 30g de carbohidratos totales por porcin, equivale a 2 porciones de  carbohidratos.  Verifique la cantidad de gramos (g) de grasas saturadas y grasas trans de una porcin. Escoja alimentos que no contengan estas grasas o que su contenido de estas sea [place].  Verifique la cantidad de miligramos (mg) de sal (sodio) en una porcin. [place] [employer] de las personas deben limitar la ingesta de sodio total a menos de 2333m por dTraining and development officer  Siempre consulte la informacin nutricional de los alimentos etiquetados como "con bajo contenido de grasa" o "sin grasa". Estos alimentos pueden tener un mayor contenido de aLocation manageragregada o carbohidratos refinados, y deben evitarse.  Hable con su nutricionista para identificar sus objetivos diarios en cuanto a los nutrientes mencionados en la etiqueta. Al ir de compras  Evite comprar alimentos procesados, enlatados o precocidos. Estos alimentos tienden a tSpecial educational needs teachermayor cantidad de [place] sodio y azcar agregada.  Compre en la zona exterior de la tienda de comestibles. Esta es la zona donde se encuentran con mayor frecuencia las frutas y las verduras frescas, los cereales a granel, las carnes frescas y los productos lcteos frescos. Al cocinar  Utilice mtodos de coccin a baja temperatura, como hornear, en lugar de mtodos de coccin a alta temperatura, como frer en abundante aceite.  Cocine con aceites saludables, como el aceite de oFrontier canola o gPiketon  Evite cocinar con manteca, crema o carnes con alto contenido de grasa. Planificacin de las comidas  Coma las comidas y los refrigerios regularmente, preferentemente a la misma hora todos lK[place] Evite pasar largos perodos de tiempo sin comer.  Consuma alimentos ricos en fibra, como frutas frescas, verduras, frijoles y cereales integrales. Consulte a su nutricionista sobre cuntas porciones de carbohidratos puede consumir en cada comida.  Consuma entre 4 y 6 onzas (entre 112 y 168g) de protenas magras por da, como carnes magras, pollo, pescado, huevos o tofu. Una onza (oz) de  protena magra equivale a: ? 1 onza (28g) de carne, pollo o pescado. ? 1huevo. ?  de taza (62 g) de tofu.  Coma algunos alimentos por da que contengan grasas saludables, como aguacates, frutos secos, semillas y pescado.   Qu alimentos debo comer? FLambert ModyBayas. Manzanas. Naranjas. Duraznos. Damascos. Ciruelas. Uvas. Mango. Papaya. GTolna Kiwi. Cerezas. VHolland CommonsLValeda Malm Espinaca. Verduras de hBoeing que incluyen col rizada, aFolsom hojas de bIraqy de mFountain Remolachas. Coliflor. Repollo. Brcoli. Zanahorias. Judas verdes. Tomates. Pimientos. Cebollas. Pepinos. Coles de Bruselas. Granos Granos integrales, como panes, galletas, tortillas, cereales y pastas de salvado o integrales. Avena sin azcar. Quinua. Arroz integral o salvaje. Carnes y oPsychiatric nurse Carne de ave sin piel. Cortes magros de ave y carne de res. Tofu. Frutos secos. Semillas. Lcteos Productos lcteos sin grasa o con bajo contenido de gPepin cUpper Stewartsville yogur y qKahaluu-Keauhou Es posible que los productos que se enumeran ms aNew Caledoniano constituyan una lista completa de los alimentos y las bebidas que puede tomar. Consulte a un nutricionista para obtener ms informacin. Qu alimentos debo evitar? FLambert ModyFrutas enlatadas al almbar. Verduras Verduras enlatadas. Verduras congeladas con mantequilla o salsa de crema. Granos Productos elaborados con hIsraely hLao People's Democratic Republic como panes, pastas, bocadillos y cereales. Evite todos los alimentos procesados. Carnes y otras protenas Cortes de carne con alto contenido de gLobbyist Carne de ave con piel. Carnes empanizadas o fritas. Carne procesada. Evite las grasas saturadas.  los alimentos y las bebidas que debe evitar. Consulte a un nutricionista para obtener ms informacin. Preguntas para hacerle al mdico Es necesario que me rena con un instructor en el cuidado  de la diabetes? Es necesario que me rena con un nutricionista? A qu nmero puedo llamar si tengo preguntas? Cules son los mejores momentos para controlar la glucemia? Dnde encontrar ms informacin: Asociacin Estadounidense de la Diabetes (American Diabetes Association): diabetes.org Academy of Nutrition and Dietetics (Academia de Nutricin y Diettica): www.eatright.org National Institute of Diabetes and Digestive and Kidney Diseases (Instituto Nacional de la Diabetes y las Enfermedades Digestivas y Renales): www.niddk.nih.gov Association of Diabetes Care and Education Specialists (Asociacin de Especialistas en Atencin y Educacin sobre la Diabetes): www.diabeteseducator.org Resumen Es importante tener hbitos alimenticios saludables debido a que sus niveles de azcar en la sangre (glucosa) se ven afectados en gran medida por lo que come y bebe. Un plan de alimentacin saludable lo ayudar a controlar la glucemia y mantener un estilo de vida saludable. El mdico puede recomendarle que trabaje con un nutricionista para elaborar el mejor plan para usted. Tenga en cuenta que los carbohidratos (hidratos de carbono) y el alcohol tienen efectos inmediatos en sus niveles de glucemia. Es importante contar los carbohidratos que ingiere y consumir alcohol con prudencia. Esta informacin no tiene como fin reemplazar el consejo del mdico. Asegrese de hacerle al mdico cualquier pregunta que tenga. Document Revised: 03/28/2019 Document Reviewed: 03/28/2019 Elsevier Patient Education  2021 Elsevier Inc.  

## 2020-07-09 NOTE — Assessment & Plan Note (Signed)
Well-controlled diabetes with hemoglobin A1c better than before at 7.0.  Continue metformin 500 mg twice a day along with diet and nutrition. Continue atorvastatin 40 mg daily. Follow-up in 6 months.

## 2020-07-09 NOTE — Progress Notes (Signed)
Michael Mitchell 60 y.o.   Chief Complaint  Patient presents with  . Diabetes    Follow up   . Hypertension   Last office visit 04/07/2020 assessment and plan as follows: Clinically stable.  No medical concerns identified during this visit.  Diet and nutrition discussed. Continue atorvastatin 40 mg daily. Increase Metformin to 500 mg twice a day. Continue Zestoretic 20-25 mg daily. Needs to follow-up with GI doctor for colonoscopy as scheduled next March. Follow-up here in 3 months  HISTORY OF PRESENT ILLNESS: This is a 59 y.o. male with history of diabetes and hypertension here for follow-up. 1.  Hypertension: On Zestoretic 20-25 daily  BP Readings from Last 3 Encounters:  07/09/20 (!) 130/96  06/25/20 120/88  05/08/20 (!) 142/86    2.  Dyslipidemia: On Lipitor 40 mg daily. Lab Results  Component Value Date   CHOL 145 01/23/2020   HDL 46 01/23/2020   LDLCALC 83 01/23/2020   TRIG 86 01/23/2020   CHOLHDL 3.2 01/23/2020   The 10-year ASCVD risk score Mikey Bussing DC Jr., et al., 2013) is: 16.6%   Values used to calculate the score:     Age: 1 years     Sex: Male     Is Non-Hispanic African American: No     Diabetic: Yes     Tobacco smoker: No     Systolic Blood Pressure: 456 mmHg     Is BP treated: Yes     HDL Cholesterol: 46 mg/dL     Total Cholesterol: 145 mg/dL  3.  Diabetes: On metformin 500 mg twice a day. Doing well.  Has no complaints or medical concerns today Lab Results  Component Value Date   HGBA1C 7.5 (H) 01/23/2020   Recent upper endoscopy and colonoscopy reports reviewed with patient. Upper endoscopy showed small hiatal hernia with small gastric ulcers.  No complications. Colonoscopy showed several polyps, internal hemorrhoids, diverticulosis.  No cancer.  Benign pathology.  HPI   Prior to Admission medications   Medication Sig Start Date End Date Taking? Authorizing Provider  atorvastatin (LIPITOR) 40 MG tablet TAKE 1 TABLET(40 MG) BY MOUTH DAILY  01/23/20  Yes Posey Boyer, MD  lisinopril-hydrochlorothiazide (ZESTORETIC) 20-25 MG tablet Take 1 tablet by mouth daily.   Yes [provider]  metFORMIN (GLUCOPHAGE) 500 MG tablet Take 1 twice daily at breakfast and supper for diabetes 01/24/20  Yes Posey Boyer, MD  naproxen sodium (ALEVE) 220 MG tablet Take 220 mg by mouth.   Yes [provider]  omeprazole (PRILOSEC) 40 MG capsule Take 1 capsule (40 mg total) by mouth 2 (two) times daily. 06/25/20  Yes Mansouraty, Telford Nab., MD  sildenafil (REVATIO) 20 MG tablet Take 2 or 3 pills 1 hour prior to anticipated intercourse as needed 01/23/20  Yes Posey Boyer, MD  sildenafil (VIAGRA) 100 MG tablet TAKE 1/2 TO 1 TABLET(50 TO 100 MG) BY MOUTH DAILY AS NEEDED FOR ERECTILE DYSFUNCTION Patient not taking: Reported on 07/09/2020 01/17/20   Horald Pollen, MD    No Known Allergies  Patient Active Problem List   Diagnosis Date Noted  . Dysphagia 05/13/2020  . Gastroesophageal reflux disease 05/13/2020  . Colon cancer screening 05/13/2020  . Positive colorectal cancer screening using Cologuard test 05/13/2020  . Diabetes mellitus type II, controlled, with no complications ([place]) 25/63/8937  . Abnormal electrocardiogram 05/29/2018  . Essential hypertension 05/29/2018  . Hyperlipidemia LDL goal <100 05/29/2018    Past Medical History:  Diagnosis Date  .  Arthritis   . Diabetes mellitus without complication ([place])   . Essential hypertension    On amlodipine 2.5 mg & lisinopril-HCTZ 20-25 mg daily.  . Hyperlipidemia LDL goal <100    On Lipitor 40 mg daily    Past Surgical History:  Procedure Laterality Date  . None      Social History   Socioeconomic History  . Marital status: Married    Spouse name: Not on file  . Number of children: Not on file  . Years of education: Not on file  . Highest education level: Not on file  Occupational History  . Not on file  Tobacco Use  . Smoking status: Never  Smoker  . Smokeless tobacco: Never Used  Substance and Sexual Activity  . Alcohol use: Never  . Drug use: Never  . Sexual activity: Yes  Other Topics Concern  . Not on file  Social History Narrative   Father of 2 daughters.   Social Determinants of Health   Financial Resource Strain: Not on file  Food Insecurity: Not on file  Transportation Needs: Not on file  Physical Activity: Not on file  Stress: Not on file  Social Connections: Not on file  Intimate Partner Violence: Not on file    Family History  Problem Relation Age of Onset  . Diabetes Mellitus II Mother   . Hypertension Mother   . Diabetes Mother   . Diabetes Mellitus II Father   . Hypertension Brother   . Pancreatic cancer Brother   . Colon cancer Neg Hx   . Esophageal cancer Neg Hx   . Inflammatory bowel disease Neg Hx   . Liver disease Neg Hx   . Rectal cancer Neg Hx   . Stomach cancer Neg Hx      Review of Systems  Constitutional: Negative.  Negative for chills and fever.  HENT: Negative.  Negative for congestion and sore throat.   Respiratory: Negative.  Negative for cough and shortness of breath.   Cardiovascular: Negative.  Negative for chest pain and palpitations.  Gastrointestinal: Negative.  Negative for abdominal pain, nausea and vomiting.  Genitourinary: Negative.  Negative for dysuria and hematuria.  Skin: Negative.  Negative for rash.  Neurological: Negative.  Negative for dizziness and headaches.  All other systems reviewed and are negative.   Today's Vitals   07/09/20 1518 07/09/20 1557  BP: (!) 130/96 130/80  Pulse: 64   Temp: 98.5 F (36.9 C)   TempSrc: Oral   SpO2: 97%   Weight: 219 lb 9.6 oz (99.6 kg)   Height: 5\' 10"  (1.778 m)    Body mass index is 31.51 kg/m.  Body mass index is 31.51 kg/m. Wt Readings from Last 3 Encounters:  07/09/20 219 lb 9.6 oz (99.6 kg)  06/25/20 221 lb (100.2 kg)  05/08/20 221 lb (100.2 kg)    Physical Exam Constitutional:      Appearance:  Normal appearance.  HENT:     Head: Normocephalic.  Eyes:     Extraocular Movements: Extraocular movements intact.     Conjunctiva/sclera: Conjunctivae normal.     Pupils: Pupils are equal, round, and reactive to light.  Cardiovascular:     Rate and Rhythm: Normal rate and regular rhythm.     Pulses: Normal pulses.     Heart sounds: Normal heart sounds.  Pulmonary:     Effort: Pulmonary effort is normal.     Breath sounds: Normal breath sounds.  Musculoskeletal:  General: Normal range of motion.     Cervical back: Normal range of motion and neck supple.  Skin:    General: Skin is warm and dry.     Capillary Refill: Capillary refill takes less than 2 seconds.  Neurological:     General: No focal deficit present.     Mental Status: He is alert and oriented to person, place, and time.  Psychiatric:        Mood and Affect: Mood normal.        Behavior: Behavior normal.      ASSESSMENT & PLAN: A total of 30 minutes was spent with the patient and counseling/coordination of care regarding hypertension and diabetes and dyslipidemia and cardiovascular risks associated with these conditions, review of all medications, review of most recent blood work results including today's hemoglobin A1c, education on nutrition, review of most recent upper endoscopy and colonoscopy results, review of most recent office visit notes, health maintenance items, prognosis, documentation, need for follow-up.  Essential hypertension Well-controlled hypertension.  Continue Zestoretic 20-25 mg daily. Diet and nutrition discussed. Follow-up in 6 months.  Dyslipidemia associated with type 2 diabetes mellitus ([place]) Well-controlled diabetes with hemoglobin A1c better than before at 7.0.  Continue metformin 500 mg twice a day along with diet and nutrition. Continue atorvastatin 40 mg daily. Follow-up in 6 months.  Jobie was seen today for diabetes and hypertension.  Diagnoses and all orders for this  visit:  Hypertension associated with diabetes ([place]) -     POCT glycosylated hemoglobin (Hb A1C)  Dyslipidemia associated with type 2 diabetes mellitus ([place])  Essential hypertension  Diverticulosis  Adenomatous polyp of sigmoid colon  Hiatal hernia  Gastric ulcer without hemorrhage or perforation, unspecified chronicity    Patient Instructions   Diabetes mellitus y nutricin, en adultos Diabetes Mellitus and Nutrition, Adult Si sufre de diabetes, o diabetes mellitus, es muy importante tener hbitos alimenticios saludables debido a que sus niveles de Designer, television/film set sangre (glucosa) se ven afectados en gran medida por lo que come y bebe. [place] alimentos saludables en las cantidades correctas, aproximadamente a la misma hora todos los [place], [place] ayudar a:  Aeronautical engineer glucemia.  Disminuir el riesgo de sufrir una enfermedad cardaca.  Mejorar la presin arterial.  Science writer o mantener un peso saludable. Qu puede afectar mi plan de alimentacin? Todas las personas que sufren de diabetes son diferentes y cada una tiene necesidades diferentes en cuanto a un plan de alimentacin. El mdico puede recomendarle que trabaje con un nutricionista para elaborar el mejor plan para usted. Su plan de alimentacin puede variar segn factores como:  Las caloras que necesita.  Los medicamentos que toma.  Su peso.  Sus niveles de glucemia, presin arterial y colesterol.  Su nivel de [place].  Otras afecciones que tenga, como enfermedades cardacas o renales. Cmo me afectan los carbohidratos? Los carbohidratos, o hidratos de carbono, afectan su nivel de glucemia ms que cualquier otro tipo de alimento. La ingesta de carbohidratos naturalmente aumenta la cantidad de [employer]. El recuento de carbohidratos es un mtodo destinado a Catering manager un registro de la cantidad de carbohidratos que se consumen. El recuento de carbohidratos es importante para Theatre manager la glucemia a un nivel  saludable, especialmente si utiliza insulina o toma determinados medicamentos por va oral para la diabetes. Es importante conocer la cantidad de carbohidratos que se pueden ingerir en cada comida sin correr Engineer, manufacturing. Esto es Psychologist, forensic. Su nutricionista puede ayudarlo a calcular  la cantidad de carbohidratos que debe ingerir en cada comida y en cada refrigerio. Cmo me afecta el alcohol? El alcohol puede provocar disminuciones sbitas de la glucemia (hipoglucemia), especialmente si utiliza insulina o toma determinados medicamentos por va oral para la diabetes. La hipoglucemia es una afeccin potencialmente mortal. Los sntomas de la hipoglucemia, como somnolencia, mareos y confusin, son similares a los sntomas de haber consumido demasiado alcohol.  No beba alcohol si: ? Su mdico le indica no hacerlo. ? Est embarazada, puede estar embarazada o est tratando de quedar embarazada.  Si bebe alcohol: ? No beba con el estmago vaco. ? Limite la cantidad que bebe:  De 0 a 1 medida por da para las mujeres.  De 0 a 2 medidas por da para los hombres. ? Est atento a la cantidad de alcohol que hay en las bebidas que toma. En los [place], una medida equivale a una botella de cerveza de 12oz (322ml), un vaso de vino de 5oz (125ml) o un vaso de una bebida alcohlica de alta graduacin de 1oz (30ml). ? Mantngase hidratado bebiendo agua, refrescos dietticos o t helado sin azcar.  Tenga en cuenta que los refrescos comunes, los jugos y otras bebida para Optician, dispensing pueden contener mucha azcar y se deben contar como carbohidratos. Consejos para seguir Photographer las etiquetas de los alimentos  Comience por leer el tamao de la porcin en la "Informacin nutricional" en las etiquetas de los alimentos envasados y las bebidas. La cantidad de caloras, carbohidratos, grasas y otros nutrientes mencionados en la etiqueta se basan en una porcin del alimento. Muchos  alimentos contienen ms de una porcin por envase.  Verifique la cantidad total de gramos (g) de carbohidratos totales en una porcin. Puede calcular la cantidad de porciones de carbohidratos al dividir el total de carbohidratos por 15. Por ejemplo, si un alimento tiene un total de 30g de carbohidratos totales por porcin, equivale a 2 porciones de carbohidratos.  Verifique la cantidad de gramos (g) de grasas saturadas y grasas trans de una porcin. Escoja alimentos que no contengan estas grasas o que su contenido de estas sea [place].  Verifique la cantidad de miligramos (mg) de sal (sodio) en una porcin. La mayora de las personas deben limitar la ingesta de sodio total a menos de 2300mg  por Training and development officer.  Siempre consulte la informacin nutricional de los alimentos etiquetados como "con bajo contenido de grasa" o "sin grasa". Estos alimentos pueden tener un mayor contenido de Location manager agregada o carbohidratos refinados, y deben evitarse.  Hable con su nutricionista para identificar sus objetivos diarios en cuanto a los nutrientes mencionados en la etiqueta. Al ir de compras  Evite comprar alimentos procesados, enlatados o precocidos. Estos alimentos tienden a Special educational needs teacher mayor cantidad de [place], sodio y azcar agregada.  Compre en la zona exterior de la tienda de comestibles. Esta es la zona donde se encuentran con mayor frecuencia las frutas y las verduras frescas, los cereales a granel, las carnes frescas y los productos lcteos frescos. Al cocinar  Utilice mtodos de coccin a baja temperatura, como hornear, en lugar de mtodos de coccin a alta temperatura, como frer en abundante aceite.  Cocine con aceites saludables, como el aceite de [place], canola o [place].  Evite cocinar con manteca, crema o carnes con alto contenido de grasa. Planificacin de las comidas  Coma las comidas y los refrigerios regularmente, preferentemente a la misma hora todos [place]. Evite pasar largos perodos de tiempo  sin comer.  Consuma alimentos  ricos en fibra, como frutas frescas, verduras, frijoles y cereales integrales. Consulte a su nutricionista sobre cuntas porciones de carbohidratos puede consumir en cada comida.  Consuma entre 4 y 6 onzas (entre 112 y 168g) de protenas magras por da, como carnes magras, pollo, pescado, huevos o tofu. Una onza (oz) de protena magra equivale a: ? 1 onza (28g) de carne, pollo o pescado. ? 1huevo. ?  de taza (62 g) de tofu.  Coma algunos alimentos por da que contengan grasas saludables, como aguacates, frutos secos, semillas y pescado.   Qu alimentos debo comer? Lambert Mody Bayas. Manzanas. Naranjas. Duraznos. Damascos. Ciruelas. Uvas. Mango. Papaya. [place]. Kiwi. Cerezas. [place] Valeda Malm. [place]spin[place]. Verduras de [employer], que incluyen col rizada, [place], hojas de [place] y de [place]. Remolachas. Coliflor. Repollo. Brcoli. Zanahorias. Judas verdes. Tomates. Pimientos. Cebollas. Pepinos. Coles de Bruselas. Granos Granos integrales, como panes, galletas, tortillas, cereales y pastas de salvado o integrales. Avena sin azcar. Quinua. Arroz integral o salvaje. Carnes y Psychiatric nurse. Carne de ave sin piel. Cortes magros de ave y carne de res. Tofu. Frutos secos. Semillas. Lcteos Productos lcteos sin grasa o con bajo contenido de [place], [place], yogur y [place]. Es posible que los productos que se enumeran ms [place] no constituyan una lista completa de los alimentos y las bebidas que puede tomar. Consulte a un nutricionista para obtener ms informacin. Qu alimentos debo evitar? Lambert Mody Frutas enlatadas al almbar. Verduras Verduras enlatadas. Verduras congeladas con mantequilla o salsa de crema. Granos Productos elaborados con [place] y [place], como panes, pastas, bocadillos y cereales. Evite todos los alimentos procesados. Carnes y otras protenas Cortes de carne con alto contenido de Lobbyist. Carne de ave con piel. Carnes  empanizadas o fritas. Carne procesada. Evite las grasas saturadas. Lcteos [place]ogur, [place] enteros. Bebidas Bebidas azucaradas, como gaseosas o t helado. Es posible que los productos que se enumeran ms [place] no constituyan una lista completa de los alimentos y las bebidas que Nurse, adult. Consulte a un nutricionista para obtener ms informacin. Preguntas para hacerle al mdico  Es necesario que me rena con Radio broadcast assistant en el cuidado de la diabetes?  Es necesario que me rena con un nutricionista?  A qu nmero puedo llamar si tengo preguntas?  Cules son los mejores momentos para controlar la glucemia? Dnde encontrar ms informacin:  Asociacin Estadounidense de la Diabetes (American Diabetes Association): diabetes.org  Academy of Nutrition and Dietetics (Academia de Nutricin y Information systems manager): www.eatright.[employer] of Diabetes and Digestive and Kidney Diseases ([place] la Diabetes y [place] y [place]): DesMoinesFuneral.dk  Association of Diabetes Care and Education Specialists (Asociacin de Especialistas en Atencin y Educacin sobre la Diabetes): www.diabeteseducator.org Resumen  Es importante tener hbitos alimenticios saludables debido a que sus niveles de Designer, television/film set sangre (glucosa) se ven afectados en gran medida por lo que come y bebe.  Un plan de alimentacin saludable lo ayudar a controlar la glucemia y Theatre manager un estilo de vida saludable.  El mdico puede recomendarle que trabaje con un nutricionista para elaborar el mejor plan para usted.  Tenga en cuenta que los carbohidratos (hidratos de carbono) y el alcohol tienen efectos inmediatos en sus niveles de glucemia. Es importante contar los carbohidratos que ingiere y consumir alcohol con prudencia. Esta informacin no tiene Marine scientist el consejo del mdico. Asegrese de hacerle al mdico cualquier pregunta que tenga. Document Revised: 03/28/2019  Document Reviewed: 03/28/2019 Elsevier Patient Education  2021 [employer].  Agustina Caroli, MD [facility]

## 2020-07-10 ENCOUNTER — Encounter: Payer: Self-pay | Admitting: Gastroenterology

## 2020-10-09 ENCOUNTER — Encounter: Payer: Self-pay | Admitting: Gastroenterology

## 2021-01-14 ENCOUNTER — Other Ambulatory Visit: Payer: Self-pay

## 2021-01-14 ENCOUNTER — Encounter: Payer: Self-pay | Admitting: Emergency Medicine

## 2021-01-14 ENCOUNTER — Ambulatory Visit (INDEPENDENT_AMBULATORY_CARE_PROVIDER_SITE_OTHER): Payer: No Typology Code available for payment source | Admitting: Emergency Medicine

## 2021-01-14 VITALS — BP 130/70 | HR 72 | Ht 70.0 in | Wt 217.0 lb

## 2021-01-14 DIAGNOSIS — E1159 Type 2 diabetes mellitus with other circulatory complications: Secondary | ICD-10-CM | POA: Diagnosis not present

## 2021-01-14 DIAGNOSIS — N529 Male erectile dysfunction, unspecified: Secondary | ICD-10-CM | POA: Diagnosis not present

## 2021-01-14 DIAGNOSIS — I152 Hypertension secondary to endocrine disorders: Secondary | ICD-10-CM

## 2021-01-14 DIAGNOSIS — E785 Hyperlipidemia, unspecified: Secondary | ICD-10-CM

## 2021-01-14 DIAGNOSIS — E1169 Type 2 diabetes mellitus with other specified complication: Secondary | ICD-10-CM | POA: Diagnosis not present

## 2021-01-14 LAB — POCT GLYCOSYLATED HEMOGLOBIN (HGB A1C): Hemoglobin A1C: 7 % — AB (ref 4.0–5.6)

## 2021-01-14 MED ORDER — SILDENAFIL CITRATE 100 MG PO TABS: 100.0000 mg | ORAL_TABLET | ORAL | 11 refills | Status: DC | PRN

## 2021-01-14 MED ORDER — DAPAGLIFLOZIN PROPANEDIOL 10 MG PO TABS: 10.0000 mg | ORAL_TABLET | Freq: Every day | ORAL | 1 refills | Status: AC

## 2021-01-14 NOTE — Progress Notes (Signed)
Michael Mitchell 60 y.o.   Chief Complaint  Patient presents with   Hypertension    HISTORY OF PRESENT ILLNESS: This is a 60 y.o. male here for follow-up of hypertension and diabetes. Is no complaints or medical concerns today. BP Readings from Last 3 Encounters:  01/14/21 140/80  07/09/20 130/80  06/25/20 120/88   Lab Results  Component Value Date   HGBA1C 7.0 (A) 07/09/2020   Wt Readings from Last 3 Encounters:  01/14/21 217 lb (98.4 kg)  07/09/20 219 lb 9.6 oz (99.6 kg)  06/25/20 221 lb (100.2 kg)     Hypertension Pertinent negatives include no chest pain, headaches, palpitations or shortness of breath.    Prior to Admission medications   Medication Sig Start Date End Date Taking? Authorizing Provider  atorvastatin (LIPITOR) 40 MG tablet TAKE 1 TABLET(40 MG) BY MOUTH DAILY 01/23/20   Posey Boyer, MD  lisinopril-hydrochlorothiazide (ZESTORETIC) 20-25 MG tablet Take 1 tablet by mouth daily.    [provider]  metFORMIN (GLUCOPHAGE) 500 MG tablet Take 1 twice daily at breakfast and supper for diabetes 01/24/20   Posey Boyer, MD  naproxen sodium (ALEVE) 220 MG tablet Take 220 mg by mouth.    [provider]  omeprazole (PRILOSEC) 40 MG capsule Take 1 capsule (40 mg total) by mouth 2 (two) times daily. 06/25/20   Mansouraty, Telford Nab., MD  sildenafil (REVATIO) 20 MG tablet Take 2 or 3 pills 1 hour prior to anticipated intercourse as needed 01/23/20   Posey Boyer, MD  sildenafil (VIAGRA) 100 MG tablet TAKE 1/2 TO 1 TABLET(50 TO 100 MG) BY MOUTH DAILY AS NEEDED FOR ERECTILE DYSFUNCTION Patient not taking: Reported on 07/09/2020 01/17/20   Horald Pollen, MD    No Known Allergies  Patient Active Problem List   Diagnosis Date Noted   Dysphagia 05/13/2020   Gastroesophageal reflux disease 05/13/2020   Colon cancer screening 05/13/2020   Positive colorectal cancer screening using Cologuard test 05/13/2020   Dyslipidemia associated with  type 2 diabetes mellitus ([place]) 10/02/2019   Abnormal electrocardiogram 05/29/2018   Essential hypertension 05/29/2018   Hyperlipidemia LDL goal <100 05/29/2018    Past Medical History:  Diagnosis Date   Arthritis    Diabetes mellitus without complication (HCC)    Essential hypertension    On amlodipine 2.5 mg & lisinopril-HCTZ 20-25 mg daily.   Hyperlipidemia LDL goal <100    On Lipitor 40 mg daily    Past Surgical History:  Procedure Laterality Date   None      Social History   Socioeconomic History   Marital status: Married    Spouse name: Not on file   Number of children: Not on file   Years of education: Not on file   Highest education level: Not on file  Occupational History   Not on file  Tobacco Use   Smoking status: Never   Smokeless tobacco: Never  Substance and Sexual Activity   Alcohol use: Never   Drug use: Never   Sexual activity: Yes  Other Topics Concern   Not on file  Social History Narrative   Father of 2 daughters.   Social Determinants of Health   Financial Resource Strain: Not on file  Food Insecurity: Not on file  Transportation Needs: Not on file  Physical Activity: Not on file  Stress: Not on file  Social Connections: Not on file  Intimate Partner Violence: Not on file    Family History  Problem Relation  Age of Onset   Diabetes Mellitus II Mother    Hypertension Mother    Diabetes Mother    Diabetes Mellitus II Father    Hypertension Brother    Pancreatic cancer Brother    Colon cancer Neg Hx    Esophageal cancer Neg Hx    Inflammatory bowel disease Neg Hx    Liver disease Neg Hx    Rectal cancer Neg Hx    Stomach cancer Neg Hx      Review of Systems  Constitutional: Negative.  Negative for chills and fever.  HENT: Negative.  Negative for congestion and sore throat.   Respiratory: Negative.  Negative for cough and shortness of breath.   Cardiovascular: Negative.  Negative for chest pain and palpitations.   Gastrointestinal: Negative.  Negative for abdominal pain, diarrhea, nausea and vomiting.  Genitourinary: Negative.  Negative for dysuria and hematuria.  Skin: Negative.  Negative for rash.  Neurological:  Negative for dizziness and headaches.  All other systems reviewed and are negative.  Vitals:   01/14/21 1601  BP: 140/80  Pulse: 72  SpO2: 99%    Physical Exam Vitals reviewed.  Constitutional:      Appearance: Normal appearance.  HENT:     Head: Normocephalic.  Eyes:     Extraocular Movements: Extraocular movements intact.     Conjunctiva/sclera: Conjunctivae normal.     Pupils: Pupils are equal, round, and reactive to light.  Cardiovascular:     Rate and Rhythm: Normal rate and regular rhythm.     Pulses: Normal pulses.     Heart sounds: Normal heart sounds.  Pulmonary:     Effort: Pulmonary effort is normal.     Breath sounds: Normal breath sounds.  Musculoskeletal:     Cervical back: Normal range of motion and neck supple.  Skin:    General: Skin is warm and dry.  Neurological:     Mental Status: He is alert and oriented to person, place, and time.  Psychiatric:        Mood and Affect: Mood normal.        Behavior: Behavior normal.  Results for orders placed or performed in visit on 01/14/21 (from the past 24 hour(s))  POCT glycosylated hemoglobin (Hb A1C)     Status: Abnormal   Collection Time: 01/14/21  4:10 PM  Result Value Ref Range   Hemoglobin A1C 7.0 (A) 4.0 - 5.6 %   HbA1c POC (<> result, manual entry)     HbA1c, POC (prediabetic range)     HbA1c, POC (controlled diabetic range)        ASSESSMENT & PLAN: Problem List Items Addressed This Visit       Cardiovascular and Mediastinum   Hypertension associated with diabetes ([place]) - Primary    Well-controlled hypertension.  Continue Zestoretic 20-25 mg daily. Hemoglobin A1c still at 7.0 today. Continue metformin 500 mg twice daily and start Farxiga 10 mg daily. Diet and nutrition  discussed. Follow-up in 3 months.      Relevant Medications   dapagliflozin propanediol (FARXIGA) 10 MG TABS tablet   sildenafil (VIAGRA) 100 MG tablet   Other Relevant Orders   POCT glycosylated hemoglobin (Hb A1C) (Completed)     Endocrine   Dyslipidemia associated with type 2 diabetes mellitus ([place])    Diet and nutrition discussed.  Continue atorvastatin 40 mg daily. The 10-year ASCVD risk score (Arnett DK, et al., 2019) is: 16.6%   Values used to calculate the score:  Age: 59 years     Sex: Male     Is Non-Hispanic African American: No     Diabetic: Yes     Tobacco smoker: No     Systolic Blood Pressure: 382 mmHg     Is BP treated: Yes     HDL Cholesterol: 46 mg/dL     Total Cholesterol: 145 mg/dL       Relevant Medications   dapagliflozin propanediol (FARXIGA) 10 MG TABS tablet   Other Visit Diagnoses     Erectile dysfunction, unspecified erectile dysfunction type       Relevant Medications   sildenafil (VIAGRA) 100 MG tablet      Patient Instructions  Diabetes mellitus y nutricin, en adultos Diabetes Mellitus and Nutrition, Adult Si sufre de diabetes, o diabetes mellitus, es muy importante tener hbitos alimenticios saludables debido a que sus niveles de Designer, television/film set sangre (glucosa) se ven afectados en gran medida por lo que come y bebe. [place] alimentos saludables en las cantidades correctas, aproximadamente a la misma hora todos los [place], [place] ayudar a: Chief Technology Officer su glucemia. Disminuir el riesgo de sufrir una enfermedad cardaca. Mejorar la presin arterial. Science writer o mantener un peso saludable. Qu puede afectar mi plan de alimentacin? Todas las personas que sufren de diabetes son diferentes y cada una tiene necesidades diferentes en cuanto a un plan de alimentacin. El mdico puede recomendarle que trabaje con un nutricionista para elaborar el mejor plan para usted. Su plan de alimentacin puede variar segn factores como: Las caloras que  necesita. Los medicamentos que toma. Su peso. Sus niveles de glucemia, presin arterial y colesterol. Su nivel de [place]. Otras afecciones que tenga, como enfermedades cardacas o renales. Cmo me afectan los carbohidratos? Los carbohidratos, o hidratos de carbono, afectan su nivel de glucemia ms que cualquier otro tipo de alimento. La ingesta de carbohidratos aumenta la cantidad de [employer]. Es importante conocer la cantidad de carbohidratos que se pueden ingerir en cada comida sin correr Engineer, manufacturing. Esto es Psychologist, forensic. Su nutricionista puede ayudarlo a calcular la cantidad de carbohidratos que debe ingerir en cada comida y en cada refrigerio. Cmo me afecta el alcohol? El alcohol puede provocar una disminucin de la glucemia (hipoglucemia), especialmente si [place] insulina o toma determinados medicamentos por va oral para la diabetes. La hipoglucemia es una afeccin potencialmente mortal. Los sntomas de la hipoglucemia, como somnolencia, mareos y confusin, son similares a los sntomas de haber consumido demasiado alcohol. No beba alcohol si: Su mdico le indica no hacerlo. Est embarazada, puede estar embarazada o est tratando de [place]. Si bebe alcohol: Limite la cantidad que bebe a lo siguiente: De 0 a 1 medida por da para las mujeres. De 0 a 2 medidas por da para los hombres. Sepa cunta cantidad de alcohol hay en las bebidas que toma. En los [place], una medida equivale a una botella de cerveza de 12 oz (355 ml), un vaso de vino de 5 oz (148 ml) o un vaso de una bebida alcohlica de alta graduacin de 1 oz (44 ml). Mantngase hidratado bebiendo agua, refrescos dietticos o t helado sin azcar. Tenga en cuenta que los refrescos comunes, los jugos y otras bebidas para mezclar pueden contener Freight forwarder y se deben contar como carbohidratos. Consejos para seguir Photographer las etiquetas de los alimentos Comience por leer el  tamao de la porcin en la etiqueta de Informacin nutricional de los alimentos envasados y las bebidas. La  cantidad de caloras, carbohidratos, grasas y otros nutrientes detallados en la etiqueta se basan en una porcin del alimento. Muchos alimentos contienen ms de una porcin por envase. Verifique la cantidad total de gramos (g) de carbohidratos totales en una porcin. Verifique la cantidad de gramos de grasas saturadas y grasas trans en una porcin. Escoja alimentos que no contengan estas grasas o que su contenido de estas sea [place]. Verifique la cantidad de miligramos (mg) de sal (sodio) en una porcin. [place] [employer] de las personas deben limitar la ingesta de sodio total a menos de 2300 mg [employer]well. Siempre consulte la informacin nutricional de los alimentos etiquetados como "con bajo contenido de grasa" o "sin grasa". Estos alimentos pueden tener un mayor contenido de Location manager agregada o carbohidratos refinados, y deben evitarse. Hable con su nutricionista para identificar sus objetivos diarios en cuanto a los nutrientes mencionados en la etiqueta. Al ir de compras Evite comprar alimentos procesados, enlatados o precocidos. Estos alimentos tienden a Special educational needs teacher mayor cantidad de [place], sodio y azcar agregada. Compre en la zona exterior de la tienda de comestibles. Esta es la zona donde se encuentran con mayor frecuencia las frutas y las verduras frescas, los cereales a granel, las carnes frescas y los productos lcteos frescos. Al cocinar Use mtodos de coccin a baja temperatura, como hornear, en lugar de mtodos de coccin a alta temperatura, como frer en abundante aceite. Cocine con aceites saludables, como el aceite de [place], canola o [place]. Evite cocinar con manteca, crema o carnes con alto contenido de grasa. Planificacin de las comidas Coma las comidas y los refrigerios regularmente, preferentemente a la misma hora todos [place]. Evite pasar largos perodos de tiempo sin comer. Consuma  alimentos ricos en fibra, como frutas frescas, verduras, frijoles y cereales integrales. Consuma entre 4 y 6 onzas (entre 112 y 168 g) de protenas magras por da, como carnes [place], pollo, pescado, huevos o tofu. Una onza (oz) (28 g) de protena magra equivale a: 1 onza (28 g) de carne, pollo o pescado. 1 huevo.  taza (62 g) de tofu. Coma algunos alimentos por da que contengan grasas saludables, como aguacates, frutos secos, semillas y pescado. Qu alimentos debo comer? Lambert Mody Bayas. Manzanas. Naranjas. Duraznos. Damascos. Ciruelas. Uvas. Mangos. Papayas. Granadas. Kiwi. Cerezas. Verduras Verduras de Boeing, que incluyen [place], [place], col rizada, acelga, hojas de berza, hojas de mostaza y repollo. Remolachas. Coliflor. Brcoli. Zanahorias. Judas verdes. Tomates. Pimientos. Cebollas. Pepinos. Coles de Bruselas. Granos Granos integrales, como panes, galletas, tortillas, cereales y pastas de salvado o integrales. Avena sin azcar. Quinua. Arroz integral o salvaje. Carnes y otras protenas Frutos de mar. Carne de ave sin piel. Cortes magros de ave y carne de res. Tofu. Frutos secos. Semillas. Lcteos Productos lcteos sin grasa o con bajo contenido de [place], [place], yogur y [place]. Es posible que los productos detallados arriba no constituyan una lista completa de los alimentos y las bebidas que puede tomar. Consulte a un nutricionista para obtener ms informacin. Qu alimentos debo evitar? Lambert Mody Frutas enlatadas al almbar. Verduras Verduras enlatadas. Verduras congeladas con mantequilla o salsa de crema. Granos Productos elaborados con [place] y [place], como panes, pastas, bocadillos y cereales. Evite todos los alimentos procesados. Carnes y otras protenas Cortes de carne con alto contenido de Lobbyist. Carne de ave con piel. Carnes empanizadas o fritas. Carne procesada. Evite las grasas saturadas. Lcteos Yogur, Christmas enteros. Bebidas Bebidas  azucaradas, como gaseosas o t helado. Es posible que los  productos que se enumeran ms arriba no constituyan [employer] de los alimentos y las bebidas que Nurse, adult. Consulte a un nutricionista para obtener ms informacin. Preguntas para hacerle al mdico Debo consultar con un especialista certificado en atencin y educacin sobre la diabetes? Es necesario que me rena con un nutricionista? A qu nmero puedo llamar si tengo preguntas? Cules son los mejores momentos para controlar la glucemia? Dnde encontrar ms informacin: American Diabetes Association (Asociacin Estadounidense de la Diabetes): diabetes.org Academy of Nutrition and Dietetics (Academia de Nutricin y Information systems manager): eatright.Unisys Corporation of Diabetes and Digestive and Kidney Diseases ([place] la Diabetes y [place] y [place]): AmenCredit.is Association of Diabetes Care & Education Specialists (Asociacin de Especialistas en Atencin y Educacin sobre la Diabetes): diabeteseducator.org Resumen Es importante tener hbitos alimenticios saludables debido a que sus niveles de Designer, television/film set sangre (glucosa) se ven afectados en gran medida por lo que come y bebe. Es importante consumir alcohol con prudencia. Un plan de comidas saludable lo ayudar a controlar la glucosa en sangre y a reducir el riesgo de enfermedades cardacas. El mdico puede recomendarle que trabaje con un nutricionista para elaborar el mejor plan para usted. Esta informacin no tiene Marine scientist el consejo del mdico. Asegrese de hacerle al mdico cualquier pregunta que tenga. Document Revised: 10/30/2019 Document Reviewed: 10/30/2019 Elsevier Patient Education  2022 [place], MD [facility]

## 2021-01-14 NOTE — Assessment & Plan Note (Signed)
Diet and nutrition discussed.  Continue atorvastatin 40 mg daily. The 10-year ASCVD risk score (Arnett DK, et al., 2019) is: 16.6%   Values used to calculate the score:     Age: 59 years     Sex: Male     Is Non-Hispanic African American: No     Diabetic: Yes     Tobacco smoker: No     Systolic Blood Pressure: 701 mmHg     Is BP treated: Yes     HDL Cholesterol: 46 mg/dL     Total Cholesterol: 145 mg/dL

## 2021-01-14 NOTE — Assessment & Plan Note (Signed)
Well-controlled hypertension.  Continue Zestoretic 20-25 mg daily. Hemoglobin A1c still at 7.0 today. Continue metformin 500 mg twice daily and start Farxiga 10 mg daily. Diet and nutrition discussed. Follow-up in 3 months.

## 2021-01-14 NOTE — Patient Instructions (Signed)
Diabetes mellitus y nutricin, en adultos Diabetes Mellitus and Nutrition, Adult Si sufre de diabetes, o diabetes mellitus, es muy importante tener hbitos alimenticios saludables debido a que sus niveles de Designer, television/film set sangre (glucosa) se ven afectados en gran medida por lo que come y bebe. Comer alimentos saludables en las cantidades correctas, aproximadamente a la misma hora todos los [place], [place] ayudar a: Chief Technology Officer su glucemia. Disminuir el riesgo de sufrir una enfermedad cardaca. Mejorar la presin arterial. Science writer o mantener un peso saludable. Qu puede afectar mi plan de alimentacin? Todas las personas que sufren de diabetes son diferentes y cada una tiene necesidades diferentes en cuanto a un plan de alimentacin. El mdico puede recomendarle que trabaje con un nutricionista para elaborar el mejor plan para usted. Su plan de alimentacin puede variar segn factores como: Las caloras que necesita. Los medicamentos que toma. Su peso. Sus niveles de glucemia, presin arterial y colesterol. Su nivel de [place]. Otras afecciones que tenga, como enfermedades cardacas o renales. Cmo me afectan los carbohidratos? Los carbohidratos, o hidratos de carbono, afectan su nivel de glucemia ms que cualquier otro tipo de alimento. La ingesta de carbohidratos aumenta la cantidad de [employer]. Es importante conocer la cantidad de carbohidratos que se pueden ingerir en cada comida sin correr Engineer, manufacturing. Esto es Psychologist, forensic. Su nutricionista puede ayudarlo a calcular la cantidad de carbohidratos que debe ingerir en cada comida y en cada refrigerio. Cmo me afecta el alcohol? El alcohol puede provocar una disminucin de la glucemia (hipoglucemia), especialmente si [place] insulina o toma determinados medicamentos por va oral para la diabetes. La hipoglucemia es una afeccin potencialmente mortal. Los sntomas de la hipoglucemia, como somnolencia, mareos y confusin, son  similares a los sntomas de haber consumido demasiado alcohol. No beba alcohol si: Su mdico le indica no hacerlo. Est embarazada, puede estar embarazada o est tratando de [place]. Si bebe alcohol: Limite la cantidad que bebe a lo siguiente: De 0 a 1 medida por da para las mujeres. De 0 a 2 medidas por da para los hombres. Sepa cunta cantidad de alcohol hay en las bebidas que toma. En los [place], una medida equivale a una botella de cerveza de 12 oz (355 ml), un vaso de vino de 5 oz (148 ml) o un vaso de una bebida alcohlica de alta graduacin de 1 oz (44 ml). Mantngase hidratado bebiendo agua, refrescos dietticos o t helado sin azcar. Tenga en cuenta que los refrescos comunes, los jugos y otras bebidas para mezclar pueden contener Freight forwarder y se deben contar como carbohidratos. Consejos para seguir Photographer las etiquetas de los alimentos Comience por leer el tamao de la porcin en la etiqueta de Informacin nutricional de los alimentos envasados y las bebidas. La cantidad de caloras, carbohidratos, grasas y otros nutrientes detallados en la etiqueta se basan en una porcin del alimento. Muchos alimentos contienen ms de una porcin por envase. Verifique la cantidad total de gramos (g) de carbohidratos totales en una porcin. Verifique la cantidad de gramos de grasas saturadas y grasas trans en una porcin. Escoja alimentos que no contengan estas grasas o que su contenido de estas sea [place]. Verifique la cantidad de miligramos (mg) de sal (sodio) en una porcin. [place] [employer] de las personas deben limitar la ingesta de sodio total a menos de 2300 mg [employer]. Siempre consulte la informacin nutricional de los alimentos etiquetados como "con bajo contenido de grasa" o "sin grasa". Estos  alimentos pueden tener un mayor contenido de Location manager agregada o carbohidratos refinados, y deben evitarse. Hable con su nutricionista para identificar sus objetivos diarios en cuanto  a los nutrientes mencionados en la etiqueta. Al ir de compras Evite comprar alimentos procesados, enlatados o precocidos. Estos alimentos tienden a Special educational needs teacher mayor cantidad de [place], sodio y azcar agregada. Compre en la zona exterior de la tienda de comestibles. Esta es la zona donde se encuentran con mayor frecuencia las frutas y las verduras frescas, los cereales a granel, las carnes frescas y los productos lcteos frescos. Al cocinar Use mtodos de coccin a baja temperatura, como hornear, en lugar de mtodos de coccin a alta temperatura, como frer en abundante aceite. Cocine con aceites saludables, como el aceite de [place], canola o [place]. Evite cocinar con manteca, crema o carnes con alto contenido de grasa. Planificacin de las comidas Coma las comidas y los refrigerios regularmente, preferentemente a la misma hora todos [place]. Evite pasar largos perodos de tiempo sin comer. Consuma alimentos ricos en fibra, como frutas frescas, verduras, frijoles y cereales integrales. Consuma entre 4 y 6 onzas (entre 112 y 168 g) de protenas magras por da, como carnes [place], pollo, pescado, huevos o tofu. Una onza (oz) (28 g) de protena magra equivale a: 1 onza (28 g) de carne, pollo o pescado. 1 huevo.  taza (62 g) de tofu. Coma algunos alimentos por da que contengan grasas saludables, como aguacates, frutos secos, semillas y pescado. Qu alimentos debo comer? Lambert Mody Bayas. Manzanas. Naranjas. Duraznos. Damascos. Ciruelas. Uvas. Mangos. Papayas. Granadas. Kiwi. Cerezas. Verduras Verduras de [employer], que incluyen [place], [place], col rizada, acelga, hojas de berza, hojas de mostaza y repollo. Remolachas. Coliflor. Brcoli. Zanahorias. Judas verdes. Tomates. Pimientos. Cebollas. Pepinos. Coles de Bruselas. Granos Granos integrales, como panes, galletas, tortillas, cereales y pastas de salvado o integrales. Avena sin azcar. Quinua. Arroz integral o salvaje. Carnes y otras  protenas Frutos de mar. Carne de ave sin piel. Cortes magros de ave y carne de res. Tofu. Frutos secos. Semillas. Lcteos Productos lcteos sin grasa o con bajo contenido de [place], [place], yogur y Chloride. Es posible que los productos detallados arriba no constituyan una lista completa de los alimentos y las bebidas que puede tomar. Consulte a un nutricionista para obtener ms informacin. Qu alimentos debo evitar? Lambert Mody Frutas enlatadas al almbar. Verduras Verduras enlatadas. Verduras congeladas con mantequilla o salsa de crema. Granos Productos elaborados con [place] y [place], como panes, pastas, bocadillos y cereales. Evite todos los alimentos procesados. Carnes y otras protenas Cortes de carne con alto contenido de Lobbyist. Carne de ave con piel. Carnes empanizadas o fritas. Carne procesada. Evite las grasas saturadas. Lcteos [place], [place] enteros. Bebidas Bebidas azucaradas, como gaseosas o t helado. Es posible que los productos que se enumeran ms [place] no constituyan una lista completa de los alimentos y las bebidas que Nurse, adult. Consulte a un nutricionista para obtener ms informacin. Preguntas para hacerle al mdico Debo consultar con un especialista certificado en atencin y educacin sobre la diabetes? Es necesario que me rena con un nutricionista? A qu nmero puedo llamar si tengo preguntas? Cules son los mejores momentos para controlar la glucemia? Dnde encontrar ms informacin: American Diabetes Association (Asociacin Estadounidense de la Diabetes): diabetes.org Academy of Nutrition and Dietetics (Academia de Nutricin y Information systems manager): eatright.[employer] Corporation of Diabetes and Digestive and Kidney Diseases ([place] la Diabetes y [place] y [place]): AmenCredit.is Association of Diabetes Care &  Education Specialists (Asociacin de Especialistas en Atencin y Educacin sobre la Diabetes):  diabeteseducator.org Resumen Es importante tener hbitos alimenticios saludables debido a que sus niveles de Designer, television/film set sangre (glucosa) se ven afectados en gran medida por lo que come y bebe. Es importante consumir alcohol con prudencia. Un plan de comidas saludable lo ayudar a controlar la glucosa en sangre y a reducir el riesgo de enfermedades cardacas. El mdico puede recomendarle que trabaje con un nutricionista para elaborar el mejor plan para usted. Esta informacin no tiene Marine scientist el consejo del mdico. Asegrese de hacerle al mdico cualquier pregunta que tenga. Document Revised: 10/30/2019 Document Reviewed: 10/30/2019 Elsevier Patient Education  2022 [employer].

## 2021-01-25 ENCOUNTER — Other Ambulatory Visit: Payer: Self-pay | Admitting: Emergency Medicine

## 2021-04-22 ENCOUNTER — Ambulatory Visit: Payer: No Typology Code available for payment source | Admitting: Emergency Medicine

## 2021-04-22 ENCOUNTER — Ambulatory Visit (INDEPENDENT_AMBULATORY_CARE_PROVIDER_SITE_OTHER): Payer: No Typology Code available for payment source | Admitting: Emergency Medicine

## 2021-04-22 ENCOUNTER — Encounter: Payer: Self-pay | Admitting: Emergency Medicine

## 2021-04-22 ENCOUNTER — Other Ambulatory Visit: Payer: Self-pay

## 2021-04-22 VITALS — BP 142/82 | HR 67 | Temp 98.2°F | Ht 70.0 in | Wt 227.0 lb

## 2021-04-22 DIAGNOSIS — I152 Hypertension secondary to endocrine disorders: Secondary | ICD-10-CM

## 2021-04-22 DIAGNOSIS — E1159 Type 2 diabetes mellitus with other circulatory complications: Secondary | ICD-10-CM | POA: Diagnosis not present

## 2021-04-22 DIAGNOSIS — E785 Hyperlipidemia, unspecified: Secondary | ICD-10-CM | POA: Diagnosis not present

## 2021-04-22 DIAGNOSIS — L723 Sebaceous cyst: Secondary | ICD-10-CM | POA: Diagnosis not present

## 2021-04-22 DIAGNOSIS — E1169 Type 2 diabetes mellitus with other specified complication: Secondary | ICD-10-CM | POA: Diagnosis not present

## 2021-04-22 LAB — POCT GLYCOSYLATED HEMOGLOBIN (HGB A1C): Hemoglobin A1C: 7.2 % — AB (ref 4.0–5.6)

## 2021-04-22 MED ORDER — EMPAGLIFLOZIN 10 MG PO TABS: 10.0000 mg | ORAL_TABLET | Freq: Every day | ORAL | 3 refills | Status: AC

## 2021-04-22 MED ORDER — VALSARTAN-HYDROCHLOROTHIAZIDE 160-12.5 MG PO TABS: 1.0000 | ORAL_TABLET | Freq: Every day | ORAL | 3 refills | Status: DC

## 2021-04-22 NOTE — Progress Notes (Signed)
Michael Mitchell 61 y.o.   Chief Complaint  Patient presents with   Follow-up    HISTORY OF PRESENT ILLNESS: This is a 61 y.o. male with history of diabetes and hypertension here for follow-up. Only taking metformin 500 mg twice a day.  Did not start [place] due to cost and not covered by insurance. States blood pressure readings at home have been persistently high. Also complaining of lump in the upper back for a couple of months. No other complaints or medical concerns today.  HPI   Prior to Admission medications   Medication Sig Start Date End Date Taking? Authorizing Provider  atorvastatin (LIPITOR) 40 MG tablet TAKE 1 TABLET(40 MG) BY MOUTH DAILY 01/23/20  Yes Posey Boyer, MD  lisinopril-hydrochlorothiazide (ZESTORETIC) 20-25 MG tablet TAKE 1 TABLET BY MOUTH DAILY 01/26/21  Yes Horald Pollen, MD  metFORMIN (GLUCOPHAGE) 500 MG tablet Take 1 twice daily at breakfast and supper for diabetes 01/24/20  Yes Posey Boyer, MD  naproxen sodium (ALEVE) 220 MG tablet Take 220 mg by mouth.   Yes [provider]  omeprazole (PRILOSEC) 40 MG capsule Take 1 capsule (40 mg total) by mouth 2 (two) times daily. 06/25/20  Yes Mansouraty, Telford Nab., MD  sildenafil (VIAGRA) 100 MG tablet Take 1 tablet (100 mg total) by mouth as needed for erectile dysfunction. 01/14/21  Yes Horald Pollen, MD    No Known Allergies  Patient Active Problem List   Diagnosis Date Noted   Dysphagia 05/13/2020   Gastroesophageal reflux disease 05/13/2020   Colon cancer screening 05/13/2020   Positive colorectal cancer screening using Cologuard test 05/13/2020   Dyslipidemia associated with type 2 diabetes mellitus ([place]) 10/02/2019   Abnormal electrocardiogram 05/29/2018   Hypertension associated with diabetes ([place]) 05/29/2018   Hyperlipidemia LDL goal <100 05/29/2018    Past Medical History:  Diagnosis Date   Arthritis    Diabetes mellitus without complication (HCC)     Essential hypertension    On amlodipine 2.5 mg & lisinopril-HCTZ 20-25 mg daily.   Hyperlipidemia LDL goal <100    On Lipitor 40 mg daily    Past Surgical History:  Procedure Laterality Date   None      Social History   Socioeconomic History   Marital status: Married    Spouse name: Not on file   Number of children: Not on file   Years of education: Not on file   Highest education level: Not on file  Occupational History   Not on file  Tobacco Use   Smoking status: Never   Smokeless tobacco: Never  Substance and Sexual Activity   Alcohol use: Never   Drug use: Never   Sexual activity: Yes  Other Topics Concern   Not on file  Social History Narrative   Father of 2 daughters.   Social Determinants of Health   Financial Resource Strain: Not on file  Food Insecurity: Not on file  Transportation Needs: Not on file  Physical Activity: Not on file  Stress: Not on file  Social Connections: Not on file  Intimate Partner Violence: Not on file    Family History  Problem Relation Age of Onset   Diabetes Mellitus II Mother    Hypertension Mother    Diabetes Mother    Diabetes Mellitus II Father    Hypertension Brother    Pancreatic cancer Brother    Colon cancer Neg Hx    Esophageal cancer Neg Hx    Inflammatory bowel disease Neg  Hx    Liver disease Neg Hx    Rectal cancer Neg Hx    Stomach cancer Neg Hx      Review of Systems  Constitutional: Negative.  Negative for chills and fever.  HENT: Negative.  Negative for congestion and sore throat.   Respiratory: Negative.  Negative for cough and shortness of breath.   Cardiovascular: Negative.  Negative for chest pain and palpitations.  Gastrointestinal:  Negative for abdominal pain, diarrhea, nausea and vomiting.  Genitourinary: Negative.   Skin: Negative.  Negative for rash.  Neurological: Negative.  Negative for dizziness and headaches.  All other systems reviewed and are negative.  Today's Vitals   04/22/21  1602  BP: (!) 142/82  Pulse: 67  Temp: 98.2 F (36.8 C)  TempSrc: Oral  SpO2: 96%  Weight: 227 lb (103 kg)  Height: 5\' 10"  (1.778 m)   Body mass index is 32.57 kg/m. Wt Readings from Last 3 Encounters:  04/22/21 227 lb (103 kg)  01/14/21 217 lb (98.4 kg)  07/09/20 219 lb 9.6 oz (99.6 kg)    Physical Exam Vitals reviewed.  Constitutional:      Appearance: Normal appearance.  HENT:     Head: Normocephalic.  Eyes:     Extraocular Movements: Extraocular movements intact.     Pupils: Pupils are equal, round, and reactive to light.  Cardiovascular:     Rate and Rhythm: Normal rate and regular rhythm.     Pulses: Normal pulses.     Heart sounds: Normal heart sounds.  Pulmonary:     Effort: Pulmonary effort is normal.     Breath sounds: Normal breath sounds.  Abdominal:     General: There is no distension.     Palpations: Abdomen is soft.     Tenderness: There is no abdominal tenderness.  Musculoskeletal:        General: Normal range of motion.  Skin:    General: Skin is warm and dry.     Comments: Positive sebaceous cyst to upper back.  Not infected.  Neurological:     General: No focal deficit present.     Mental Status: He is alert and oriented to person, place, and time.  Psychiatric:        Mood and Affect: Mood normal.        Behavior: Behavior normal.     ASSESSMENT & PLAN: A total of 45 minutes was spent with the patient and counseling/coordination of care regarding preparing for this visit, review of most recent office visit notes, review of most recent blood work results including today's hemoglobin A1c, review of all medications and changes made including addition of Jardiance 10 mg daily, blood pressure management and need to change blood pressure medication, education on nutrition, cardiovascular risks associated with uncontrolled diabetes and hypertension, prognosis, documentation and need for follow-up in 3 months.  Problem List Items Addressed This Visit        Cardiovascular and Mediastinum   Hypertension associated with diabetes ([place]) - Primary    Uncontrolled hypertension with elevated blood pressure readings at home also. Stop Zestoretic and start valsartan-HCTZ 160-12.5 mg daily. Continue monitoring blood pressure readings at home daily for the next several weeks and keep a log. Uncontrolled diabetes with hemoglobin A1c higher than before at 7.2.  Patient only taking metformin.  Did not start [place] as prescribed last time.  Not covered by insurance. Continue metformin 500 mg twice a day and start Jardiance 10 mg daily. Lab Results  Component  Value Date   CREATININE 0.93 01/23/2020   BUN 17 01/23/2020   NA 145 (H) 01/23/2020   K 4.3 01/23/2020   CL 102 01/23/2020   CO2 31 (H) 01/23/2020  Diet and nutrition discussed. Follow-up in 3 months.       Relevant Medications   empagliflozin (JARDIANCE) 10 MG TABS tablet   valsartan-hydrochlorothiazide (DIOVAN-HCT) 160-12.5 MG tablet   Other Relevant Orders   POCT HgB A1C (Completed)     Endocrine   Dyslipidemia associated with type 2 diabetes mellitus (HCC)    Stable.  Diet and nutrition discussed.  Continue atorvastatin 40 mg daily. The 10-year ASCVD risk score (Arnett DK, et al., 2019) is: 20.9%   Values used to calculate the score:     Age: 2 years     Sex: Male     Is Non-Hispanic African American: No     Diabetic: Yes     Tobacco smoker: No     Systolic Blood Pressure: 858 mmHg     Is BP treated: Yes     HDL Cholesterol: 46 mg/dL     Total Cholesterol: 145 mg/dL       Relevant Medications   empagliflozin (JARDIANCE) 10 MG TABS tablet   valsartan-hydrochlorothiazide (DIOVAN-HCT) 160-12.5 MG tablet   Other Relevant Orders   POCT HgB A1C (Completed)   Other Visit Diagnoses     Sebaceous cyst          Patient Instructions  Diabetes mellitus y nutricin, en adultos Diabetes Mellitus and Nutrition, Adult Si sufre de diabetes, o diabetes mellitus, es muy  importante tener hbitos alimenticios saludables debido a que sus niveles de Designer, television/film set sangre (glucosa) se ven afectados en gran medida por lo que come y bebe. Comer alimentos saludables en las cantidades correctas, aproximadamente a la misma hora todos los [place], [place] ayudar a: Chief Technology Officer su glucemia. Disminuir el riesgo de sufrir una enfermedad cardaca. Mejorar la presin arterial. Science writer o mantener un peso saludable. Qu puede afectar mi plan de alimentacin? Todas las personas que sufren de diabetes son diferentes y cada una tiene necesidades diferentes en cuanto a un plan de alimentacin. El mdico puede recomendarle que trabaje con un nutricionista para elaborar el mejor plan para usted. Su plan de alimentacin puede variar segn factores como: Las caloras que necesita. Los medicamentos que toma. Su peso. Sus niveles de glucemia, presin arterial y colesterol. Su nivel de [place]. Otras afecciones que tenga, como enfermedades cardacas o renales. Cmo me afectan los carbohidratos? Los carbohidratos, o hidratos de carbono, afectan su nivel de glucemia ms que cualquier otro tipo de alimento. La ingesta de carbohidratos aumenta la cantidad de [employer]. Es importante conocer la cantidad de carbohidratos que se pueden ingerir en cada comida sin correr Engineer, manufacturing. Esto es Psychologist, forensic. Su nutricionista puede ayudarlo a calcular la cantidad de carbohidratos que debe ingerir en cada comida y en cada refrigerio. Cmo me afecta el alcohol? El alcohol puede provocar una disminucin de la glucemia (hipoglucemia), especialmente si [place] insulina o toma determinados medicamentos por va oral para la diabetes. La hipoglucemia es una afeccin potencialmente mortal. Los sntomas de la hipoglucemia, como somnolencia, mareos y confusin, son similares a los sntomas de haber consumido demasiado alcohol. No beba alcohol si: Su mdico le indica no hacerlo. Est embarazada,  puede estar embarazada o est tratando de [place]. Si bebe alcohol: Limite la cantidad que bebe a lo siguiente: De 0 a 1 medida por da  para las mujeres. De 0 a 2 medidas por da para los hombres. Sepa cunta cantidad de alcohol hay en las bebidas que toma. En los [place], una medida equivale a una botella de cerveza de 12 oz (355 ml), un vaso de vino de 5 oz (148 ml) o un vaso de una bebida alcohlica de alta graduacin de 1 oz (44 ml). Mantngase hidratado bebiendo agua, refrescos dietticos o t helado sin azcar. Tenga en cuenta que los refrescos comunes, los jugos y otras bebidas para mezclar pueden contener Freight forwarder y se deben contar como carbohidratos. Consejos para seguir Photographer las etiquetas de los alimentos Comience por leer el tamao de la porcin en la etiqueta de Informacin nutricional de los alimentos envasados y las bebidas. La cantidad de caloras, carbohidratos, grasas y otros nutrientes detallados en la etiqueta se basan en una porcin del alimento. Muchos alimentos contienen ms de una porcin por envase. Verifique la cantidad total de gramos (g) de carbohidratos totales en una porcin. Verifique la cantidad de gramos de grasas saturadas y grasas trans en una porcin. Escoja alimentos que no contengan estas grasas o que su contenido de estas sea [place]. Verifique la cantidad de miligramos (mg) de sal (sodio) en una porcin. La [employer] de las personas deben limitar la ingesta de sodio total a menos de 2300 mg Honeywell. Siempre consulte la informacin nutricional de los alimentos etiquetados como con bajo contenido de [place] o sin grasa. Estos alimentos pueden tener un mayor contenido de Location manager agregada o carbohidratos refinados, y deben evitarse. Hable con su nutricionista para identificar sus objetivos diarios en cuanto a los nutrientes mencionados en la etiqueta. Al ir de compras Evite comprar alimentos procesados, enlatados o precocidos. Estos  alimentos tienden a Special educational needs teacher mayor cantidad de [place], sodio y azcar agregada. Compre en la zona exterior de la tienda de comestibles. Esta es la zona donde se encuentran con mayor frecuencia las frutas y las verduras frescas, los cereales a granel, las carnes frescas y los productos lcteos frescos. Al cocinar Use mtodos de coccin a baja temperatura, como hornear, en lugar de mtodos de coccin a alta temperatura, como frer en abundante aceite. Cocine con aceites saludables, como el aceite de [place], canola o [place]. Evite cocinar con manteca, crema o carnes con alto contenido de grasa. Planificacin de las comidas Coma las comidas y los refrigerios regularmente, preferentemente a la misma hora todos [place]. Evite pasar largos perodos de tiempo sin comer. Consuma alimentos ricos en fibra, como frutas frescas, verduras, frijoles y cereales integrales. Consuma entre 4 y 6 onzas (entre 112 y 168 g) de protenas magras por da, como carnes [place], pollo, pescado, huevos o tofu. Una onza (oz) (28 g) de protena magra equivale a: 1 onza (28 g) de carne, pollo o pescado. 1 huevo.  taza (62 g) de tofu. Coma algunos alimentos por da que contengan grasas saludables, como aguacates, frutos secos, semillas y pescado. Qu alimentos debo comer? Lambert Mody Bayas. Manzanas. Naranjas. Duraznos. Damascos. Ciruelas. Uvas. Mangos. Papayas. Granadas. Kiwi. Cerezas. Verduras Verduras de Boeing, que incluyen [place], [place], col rizada, acelga, hojas de berza, hojas de mostaza y repollo. Remolachas. Coliflor. Brcoli. Zanahorias. Judas verdes. Tomates. Pimientos. Cebollas. Pepinos. Coles de Bruselas. Granos Granos integrales, como panes, galletas, tortillas, cereales y pastas de salvado o integrales. Avena sin azcar. Quinua. Arroz integral o salvaje. Carnes y otras protenas Frutos de mar. Carne de ave sin piel. Cortes magros de ave y carne de res. Tofu. Frutos  secos. Semillas. Lcteos Productos  lcteos sin grasa o con bajo contenido de [place], [place], yogur y [place]. Es posible que los productos detallados arriba no constituyan una lista completa de los alimentos y las bebidas que puede tomar. Consulte a un nutricionista para obtener ms informacin. Qu alimentos debo evitar? Lambert Mody Frutas enlatadas al almbar. Verduras Verduras enlatadas. Verduras congeladas con mantequilla o salsa de crema. Granos Productos elaborados con [place] y [place], como panes, pastas, bocadillos y cereales. Evite todos los alimentos procesados. Carnes y otras protenas Cortes de carne con alto contenido de Lobbyist. Carne de ave con piel. Carnes empanizadas o fritas. Carne procesada. Evite las grasas saturadas. Lcteos [place], [place] enteros. Bebidas Bebidas azucaradas, como gaseosas o t helado. Es posible que los productos que se enumeran ms [place] no constituyan una lista completa de los alimentos y las bebidas que Nurse, adult. Consulte a un nutricionista para obtener ms informacin. Preguntas para hacerle al mdico Debo consultar con un especialista certificado en atencin y educacin sobre la diabetes? Es necesario que me rena con un nutricionista? A qu nmero puedo llamar si tengo preguntas? Cules son los mejores momentos para controlar la glucemia? Dnde encontrar ms informacin: American Diabetes Association (Asociacin Estadounidense de la Diabetes): diabetes.org Academy of Nutrition and Dietetics (Academia de Nutricin y Information systems manager): eatright.Unisys Corporation of Diabetes and Digestive and Kidney Diseases ([place] la Diabetes y [place] y Renales): AmenCredit.is Association of Diabetes Care & Education Specialists (Asociacin de Especialistas en Atencin y Educacin sobre la Diabetes): diabeteseducator.org Resumen Es importante tener hbitos alimenticios saludables debido a que sus niveles de Designer, television/film set sangre (glucosa) se  ven afectados en gran medida por lo que come y bebe. Es importante consumir alcohol con prudencia. Un plan de comidas saludable lo ayudar a controlar la glucosa en sangre y a reducir el riesgo de enfermedades cardacas. El mdico puede recomendarle que trabaje con un nutricionista para elaborar el mejor plan para usted. Esta informacin no tiene Marine scientist el consejo del mdico. Asegrese de hacerle al mdico cualquier pregunta que tenga. Document Revised: 10/30/2019 Document Reviewed: 10/30/2019 Elsevier Patient Education  2022 [place], MD [facility]

## 2021-04-22 NOTE — Patient Instructions (Signed)
Diabetes mellitus y nutricin, en adultos Diabetes Mellitus and Nutrition, Adult Si sufre de diabetes, o diabetes mellitus, es muy importante tener hbitos alimenticios saludables debido a que sus niveles de Designer, television/film set sangre (glucosa) se ven afectados en gran medida por lo que come y bebe. [place] alimentos saludables en las cantidades correctas, aproximadamente a la misma hora todos los [place], [place] ayudar a: Chief Technology Officer su glucemia. Disminuir el riesgo de sufrir una enfermedad cardaca. Mejorar la presin arterial. Science writer o mantener un peso saludable. Qu puede afectar mi plan de alimentacin? Todas las personas que sufren de diabetes son diferentes y cada una tiene necesidades diferentes en cuanto a un plan de alimentacin. El mdico puede recomendarle que trabaje con un nutricionista para elaborar el mejor plan para usted. Su plan de alimentacin puede variar segn factores como: Las caloras que necesita. Los medicamentos que toma. Su peso. Sus niveles de glucemia, presin arterial y colesterol. Su nivel de [place]. Otras afecciones que tenga, como enfermedades cardacas o renales. Cmo me afectan los carbohidratos? Los carbohidratos, o hidratos de carbono, afectan su nivel de glucemia ms que cualquier otro tipo de alimento. La ingesta de carbohidratos aumenta la cantidad de [employer]. Es importante conocer la cantidad de carbohidratos que se pueden ingerir en cada comida sin correr Engineer, manufacturing. Esto es Psychologist, forensic. Su nutricionista puede ayudarlo a calcular la cantidad de carbohidratos que debe ingerir en cada comida y en cada refrigerio. Cmo me afecta el alcohol? El alcohol puede provocar una disminucin de la glucemia (hipoglucemia), especialmente si [place] insulina o toma determinados medicamentos por va oral para la diabetes. La hipoglucemia es una afeccin potencialmente mortal. Los sntomas de la hipoglucemia, como somnolencia, mareos y confusin, son  similares a los sntomas de haber consumido demasiado alcohol. No beba alcohol si: Su mdico le indica no hacerlo. Est embarazada, puede estar embarazada o est tratando de [place]. Si bebe alcohol: Limite la cantidad que bebe a lo siguiente: De 0 a 1 medida por da para las mujeres. De 0 a 2 medidas por da para los hombres. Sepa cunta cantidad de alcohol hay en las bebidas que toma. En los [place], una medida equivale a una botella de cerveza de 12 oz (355 ml), un vaso de vino de 5 oz (148 ml) o un vaso de una bebida alcohlica de alta graduacin de 1 oz (44 ml). Mantngase hidratado bebiendo agua, refrescos dietticos o t helado sin azcar. Tenga en cuenta que los refrescos comunes, los jugos y otras bebidas para mezclar pueden contener Freight forwarder y se deben contar como carbohidratos. Consejos para seguir Photographer las etiquetas de los alimentos Comience por leer el tamao de la porcin en la etiqueta de Informacin nutricional de los alimentos envasados y las bebidas. La cantidad de caloras, carbohidratos, grasas y otros nutrientes detallados en la etiqueta se basan en una porcin del alimento. Muchos alimentos contienen ms de una porcin por envase. Verifique la cantidad total de gramos (g) de carbohidratos totales en una porcin. Verifique la cantidad de gramos de grasas saturadas y grasas trans en una porcin. Escoja alimentos que no contengan estas grasas o que su contenido de estas sea [place]. Verifique la cantidad de miligramos (mg) de sal (sodio) en una porcin. La State Farm de las personas deben limitar la ingesta de sodio total a menos de 2300 mg Honeywell. Siempre consulte la informacin nutricional de los alimentos etiquetados como con bajo contenido de [place] o sin grasa. Estos  alimentos pueden tener un mayor contenido de Location manager agregada o carbohidratos refinados, y deben evitarse. Hable con su nutricionista para identificar sus objetivos diarios en cuanto  a los nutrientes mencionados en la etiqueta. Al ir de compras Evite comprar alimentos procesados, enlatados o precocidos. Estos alimentos tienden a Special educational needs teacher mayor cantidad de [place], sodio y azcar agregada. Compre en la zona exterior de la tienda de comestibles. Esta es la zona donde se encuentran con mayor frecuencia las frutas y las verduras frescas, los cereales a granel, las carnes frescas y los productos lcteos frescos. Al cocinar Use mtodos de coccin a baja temperatura, como hornear, en lugar de mtodos de coccin a alta temperatura, como frer en abundante aceite. Cocine con aceites saludables, como el aceite de [place], canola o [place]. Evite cocinar con manteca, crema o carnes con alto contenido de grasa. Planificacin de las comidas Coma las comidas y los refrigerios regularmente, preferentemente a la misma hora todos [place]. Evite pasar largos perodos de tiempo sin comer. Consuma alimentos ricos en fibra, como frutas frescas, verduras, frijoles y cereales integrales. Consuma entre 4 y 6 onzas (entre 112 y 168 g) de protenas magras por da, como carnes [place], pollo, pescado, huevos o tofu. Una onza (oz) (28 g) de protena magra equivale a: 1 onza (28 g) de carne, pollo o pescado. 1 huevo.  taza (62 g) de tofu. Coma algunos alimentos por da que contengan grasas saludables, como aguacates, frutos secos, semillas y pescado. Qu alimentos debo comer? Lambert Mody Bayas. Manzanas. Naranjas. Duraznos. Damascos. Ciruelas. Uvas. Mangos. Papayas. Granadas. Kiwi. Cerezas. Verduras Verduras de Boeing, que incluyen [place], [place], col rizada, acelga, hojas de berza, hojas de mostaza y repollo. Remolachas. Coliflor. Brcoli. Zanahorias. Judas verdes. Tomates. Pimientos. Cebollas. Pepinos. Coles de Bruselas. Granos Granos integrales, como panes, galletas, tortillas, cereales y pastas de salvado o integrales. Avena sin azcar. Quinua. Arroz integral o salvaje. Carnes y otras  protenas Frutos de mar. Carne de ave sin piel. Cortes magros de ave y carne de res. Tofu. Frutos secos. Semillas. Lcteos Productos lcteos sin grasa o con bajo contenido de [place], [place], yogur y [place]. Es posible que los productos detallados arriba no constituyan una lista completa de los alimentos y las bebidas que puede tomar. Consulte a un nutricionista para obtener ms informacin. Qu alimentos debo evitar? Lambert Mody Frutas enlatadas al almbar. Verduras Verduras enlatadas. Verduras congeladas con mantequilla o salsa de crema. Granos Productos elaborados con [place] y [place], como panes, pastas, bocadillos y cereales. Evite todos los alimentos procesados. Carnes y otras protenas Cortes de carne con alto contenido de Lobbyist. Carne de ave con piel. Carnes empanizadas o fritas. Carne procesada. Evite las grasas saturadas. Lcteos Yogur, [place] enteros. Bebidas Bebidas azucaradas, como gaseosas o t helado. Es posible que los productos que se enumeran ms [place] no constituyan una lista completa de los alimentos y las bebidas que Nurse, adult. Consulte a un nutricionista para obtener ms informacin. Preguntas para hacerle al mdico Debo consultar con un especialista certificado en atencin y educacin sobre la diabetes? Es necesario que me rena con un nutricionista? A qu nmero puedo llamar si tengo preguntas? Cules son los mejores momentos para controlar la glucemia? Dnde encontrar ms informacin: American Diabetes Association (Asociacin Estadounidense de la Diabetes): diabetes.org Academy of Nutrition and Dietetics (Academia de Nutricin y Information systems manager): eatright.Unisys Corporation of Diabetes and Digestive and Kidney Diseases ([place] la Diabetes y [place] y Renales): AmenCredit.is Association of Diabetes Care &  Education Specialists (Asociacin de Especialistas en Atencin y Educacin sobre la Diabetes):  diabeteseducator.org Resumen Es importante tener hbitos alimenticios saludables debido a que sus niveles de Designer, television/film set sangre (glucosa) se ven afectados en gran medida por lo que come y bebe. Es importante consumir alcohol con prudencia. Un plan de comidas saludable lo ayudar a controlar la glucosa en sangre y a reducir el riesgo de enfermedades cardacas. El mdico puede recomendarle que trabaje con un nutricionista para elaborar el mejor plan para usted. Esta informacin no tiene Marine scientist el consejo del mdico. Asegrese de hacerle al mdico cualquier pregunta que tenga. Document Revised: 10/30/2019 Document Reviewed: 10/30/2019 Elsevier Patient Education  2022 [employer].

## 2021-04-22 NOTE — Assessment & Plan Note (Signed)
Uncontrolled hypertension with elevated blood pressure readings at home also. Stop Zestoretic and start valsartan-HCTZ 160-12.5 mg daily. Continue monitoring blood pressure readings at home daily for the next several weeks and keep a log. Uncontrolled diabetes with hemoglobin A1c higher than before at 7.2.  Patient only taking metformin.  Did not start [place] as prescribed last time.  Not covered by insurance. Continue metformin 500 mg twice a day and start Jardiance 10 mg daily. Lab Results  Component Value Date   CREATININE 0.93 01/23/2020   BUN 17 01/23/2020   NA 145 (H) 01/23/2020   K 4.3 01/23/2020   CL 102 01/23/2020   CO2 31 (H) 01/23/2020  Diet and nutrition discussed. Follow-up in 3 months.

## 2021-04-22 NOTE — Assessment & Plan Note (Signed)
Stable.  Diet and nutrition discussed.  Continue atorvastatin 40 mg daily. The 10-year ASCVD risk score (Arnett DK, et al., 2019) is: 20.9%   Values used to calculate the score:     Age: 61 years     Sex: Male     Is Non-Hispanic African American: No     Diabetic: Yes     Tobacco smoker: No     Systolic Blood Pressure: 570 mmHg     Is BP treated: Yes     HDL Cholesterol: 46 mg/dL     Total Cholesterol: 145 mg/dL

## 2021-05-01 ENCOUNTER — Other Ambulatory Visit: Payer: Self-pay | Admitting: Emergency Medicine

## 2021-05-01 DIAGNOSIS — E1165 Type 2 diabetes mellitus with hyperglycemia: Secondary | ICD-10-CM

## 2021-05-01 DIAGNOSIS — E785 Hyperlipidemia, unspecified: Secondary | ICD-10-CM

## 2021-05-03 ENCOUNTER — Telehealth: Payer: Self-pay | Admitting: Emergency Medicine

## 2021-05-03 NOTE — Telephone Encounter (Signed)
Continue valsartan-HCTZ.  Continue monitoring daily blood pressure readings at home for the next several weeks.  Thanks.

## 2021-05-03 NOTE — Telephone Encounter (Signed)
Advised pts son of provider's response, Pts son states bp reading as of 05-03-2021 is 147/98  Please advise

## 2021-05-03 NOTE — Telephone Encounter (Signed)
He was taking lisinopril-HCTZ and was switched to valsartan-HCTZ.  Diuretic is the same, HCTZ.  Valsartan is better than lisinopril and very similar. What are the blood pressure readings at home?  The valsartan dose may need to be adjusted.  Thanks.

## 2021-05-03 NOTE — Telephone Encounter (Signed)
PT and son visit today in regards to PT's blood pressure medication. PT visited on February the 16th and was prescribed valsartan-hydrochlorothiazide instead of the usual lisinopril and was wanting to know more information about that. PT feels that the dosage of the new medication might be too high. PT is also having issues toleration size of tablet.  CB Son: [phone] [facility] PT: [phone] and will need someone that speaks Spanish

## 2021-05-04 NOTE — Telephone Encounter (Signed)
Called patient to inform him to continue to monitor BP at home as well as continue to take the Valsartan - HCTZ. Patient son verbalize understanding. No further questions

## 2021-07-21 ENCOUNTER — Ambulatory Visit: Payer: No Typology Code available for payment source | Admitting: Emergency Medicine

## 2023-05-16 ENCOUNTER — Encounter (HOSPITAL_COMMUNITY): Payer: Self-pay | Admitting: Emergency Medicine

## 2023-05-16 ENCOUNTER — Emergency Department (HOSPITAL_COMMUNITY)

## 2023-05-16 ENCOUNTER — Other Ambulatory Visit: Payer: Self-pay

## 2023-05-16 ENCOUNTER — Observation Stay (HOSPITAL_COMMUNITY)
Admission: EM | Admit: 2023-05-16 | Discharge: 2023-05-18 | Disposition: A | Discharge status: Home / Self Care | Attending: Internal Medicine | Admitting: Internal Medicine

## 2023-05-16 DIAGNOSIS — Z7984 Long term (current) use of oral hypoglycemic drugs: Secondary | ICD-10-CM | POA: Insufficient documentation

## 2023-05-16 DIAGNOSIS — E1159 Type 2 diabetes mellitus with other circulatory complications: Secondary | ICD-10-CM | POA: Diagnosis present

## 2023-05-16 DIAGNOSIS — Z79899 Other long term (current) drug therapy: Secondary | ICD-10-CM | POA: Insufficient documentation

## 2023-05-16 DIAGNOSIS — E119 Type 2 diabetes mellitus without complications: Secondary | ICD-10-CM | POA: Insufficient documentation

## 2023-05-16 DIAGNOSIS — I10 Essential (primary) hypertension: Secondary | ICD-10-CM | POA: Diagnosis not present

## 2023-05-16 DIAGNOSIS — E1169 Type 2 diabetes mellitus with other specified complication: Secondary | ICD-10-CM | POA: Diagnosis present

## 2023-05-16 DIAGNOSIS — E785 Hyperlipidemia, unspecified: Secondary | ICD-10-CM | POA: Diagnosis not present

## 2023-05-16 DIAGNOSIS — R079 Chest pain, unspecified: Secondary | ICD-10-CM | POA: Diagnosis present

## 2023-05-16 LAB — BASIC METABOLIC PANEL
Anion gap: 16 — ABNORMAL HIGH (ref 5–15)
BUN: 22 mg/dL (ref 8–23)
CO2: 18 mmol/L — ABNORMAL LOW (ref 22–32)
Calcium: 8.9 mg/dL (ref 8.9–10.3)
Chloride: 104 mmol/L (ref 98–111)
Creatinine, Ser: 0.94 mg/dL (ref 0.61–1.24)
GFR, Estimated: >60 mL/min (ref >60)
Glucose, Bld: 111 mg/dL — ABNORMAL HIGH (ref 70–99)
Potassium: 3.6 mmol/L (ref 3.5–5.1)
Sodium: 138 mmol/L (ref 135–145)

## 2023-05-16 LAB — CBC
HCT: 48.1 % (ref 39.0–52.0)
Hemoglobin: 16.1 g/dL (ref 13.0–17.0)
MCH: 28.6 pg (ref 26.0–34.0)
MCHC: 33.5 g/dL (ref 30.0–36.0)
MCV: 85.6 fL (ref 80.0–100.0)
Platelets: 167 10*3/uL (ref 150–400)
RBC: 5.62 MIL/uL (ref 4.22–5.81)
RDW: 13.1 % (ref 11.5–15.5)
WBC: 8.7 10*3/uL (ref 4.0–10.5)
nRBC: 0 % (ref 0.0–0.2)

## 2023-05-16 LAB — TROPONIN I (HIGH SENSITIVITY): Troponin I (High Sensitivity): 3 ng/L (ref <18)

## 2023-05-16 MED ORDER — ASPIRIN 81 MG PO CHEW
324.0000 mg | CHEWABLE_TABLET | Freq: Once | ORAL | Status: AC
  Administered 2023-05-16: 324 mg via ORAL
  Filled 2023-05-16: qty 4

## 2023-05-16 MED ORDER — IOHEXOL 350 MG/ML SOLN
100.0000 mL | Freq: Once | INTRAVENOUS | Status: AC | PRN
  Administered 2023-05-16: 100 mL via INTRAVENOUS

## 2023-05-16 NOTE — ED Notes (Signed)
 ipad interpreter at bedside for triage PA

## 2023-05-16 NOTE — ED Provider Notes (Signed)
 [place] EMERGENCY DEPARTMENT AT [facility] Provider Note   CSN: [id number] Arrival date & time: 05/16/23  2105     History {Add pertinent medical, surgical, social history, OB history to HPI:1} Chief Complaint  Patient presents with   Abnormal ECG   Back Pain    Michael Mitchell is a 63 y.o. male.  Level 5 caveat for language barrier.  Translator is used.  Patient sent from urgent care with left-sided "rib" pain and abnormal EKG.  Reports for the past 3 days he has had intermittent pain to his left ribs that radiates to his left arm associate with numbness and tingling.  Pain comes and goes lasting for several hours at a time.  Not have any pain currently.  Some associated shortness of breath.  No nausea, vomiting, cough, fever, diaphoresis.  Pain is not exertional or pleuritic.  Denies cardiac history.  Does have a history of hypertension, hyperlipidemia and high cholesterol.  Pain not worse with arm movement.  Pain not exertional or pleuritic.  Denies any abdominal pain or back pain.  The history is provided by the patient.  Back Pain Associated symptoms: chest pain   Associated symptoms: no abdominal pain, no dysuria, no fever, no headaches and no weakness        Home Medications Prior to Admission medications   Medication Sig Start Date End Date Taking? Authorizing Provider  atorvastatin (LIPITOR) 40 MG tablet TAKE 1 TABLET(40 MG) BY MOUTH DAILY 05/02/21   Georgina Quint, MD  metFORMIN (GLUCOPHAGE) 500 MG tablet TAKE 1 TABLET(500 MG) BY MOUTH DAILY AT 6 PM 05/02/21   Sagardia, Eilleen Kempf, MD  naproxen sodium (ALEVE) 220 MG tablet Take 220 mg by mouth.    [provider]  omeprazole (PRILOSEC) 40 MG capsule Take 1 capsule (40 mg total) by mouth 2 (two) times daily. 06/25/20   Mansouraty, Netty Starring., MD  sildenafil (VIAGRA) 100 MG tablet Take 1 tablet (100 mg total) by mouth as needed for erectile dysfunction. 01/14/21   Georgina Quint, MD   valsartan-hydrochlorothiazide (DIOVAN-HCT) 160-12.5 MG tablet Take 1 tablet by mouth daily. 04/22/21   Georgina Quint, MD      Allergies    Patient has no known allergies.    Review of Systems   Review of Systems  Constitutional:  Negative for activity change, appetite change and fever.  HENT:  Negative for congestion and rhinorrhea.   Respiratory:  Positive for chest tightness and shortness of breath.   Cardiovascular:  Positive for chest pain.  Gastrointestinal:  Negative for abdominal pain, nausea and vomiting.  Genitourinary:  Negative for dysuria and hematuria.  Musculoskeletal:  Negative for back pain.  Skin:  Negative for rash.  Neurological:  Negative for dizziness, weakness and headaches.   all other systems are negative except as noted in the HPI and PMH.    Physical Exam Updated Vital Signs BP (!) 159/98 (BP Location: Right Arm)   Pulse 72   Temp (!) 97.3 F (36.3 C)   Resp 16   Ht 5\' 10"  (1.778 m)   Wt 103 kg   SpO2 99%   BMI 32.57 kg/m  Physical Exam Vitals and nursing note reviewed.  Constitutional:      General: He is not in acute distress.    Appearance: He is well-developed. He is not ill-appearing.  HENT:     Head: Normocephalic and atraumatic.     Mouth/Throat:     Pharynx: No oropharyngeal exudate.  Eyes:     Conjunctiva/sclera: Conjunctivae normal.     Pupils: Pupils are equal, round, and reactive to light.  Neck:     Comments: No meningismus. Cardiovascular:     Rate and Rhythm: Normal rate and regular rhythm.     Heart sounds: Normal heart sounds. No murmur heard. Pulmonary:     Effort: Pulmonary effort is normal. No respiratory distress.     Breath sounds: Normal breath sounds.  Chest:     Chest wall: No tenderness.  Abdominal:     Palpations: Abdomen is soft.     Tenderness: There is no abdominal tenderness. There is no guarding or rebound.  Musculoskeletal:        General: No tenderness. Normal range of motion.     Cervical  back: Normal range of motion and neck supple.     Comments: Intact radial pulses. Cardinal hand movements intact.   Skin:    General: Skin is warm.  Neurological:     Mental Status: He is alert and oriented to person, place, and time.     Cranial Nerves: No cranial nerve deficit.     Motor: No abnormal muscle tone.     Coordination: Coordination normal.     Comments:  5/5 strength throughout. CN 2-12 intact.Equal grip strength.   Psychiatric:        Behavior: Behavior normal.     ED Results / Procedures / Treatments   Labs (all labs ordered are listed, but only abnormal results are displayed) Labs Reviewed  BASIC METABOLIC PANEL - Abnormal; Notable for the following components:      Result Value   CO2 18 (*)    Glucose, Bld 111 (*)    Anion gap 16 (*)    All other components within normal limits  CBC  D-DIMER, QUANTITATIVE  TROPONIN I (HIGH SENSITIVITY)  TROPONIN I (HIGH SENSITIVITY)    EKG EKG Interpretation Date/Time:  Tuesday May 16 2023 21:18:14 EDT Ventricular Rate:  70 PR Interval:  178 QRS Duration:  160 QT Interval:  462 QTC Calculation: 498 R Axis:   146  Text Interpretation: Normal sinus rhythm Indeterminate axis Right bundle branch block Possible Inferior infarct , age undetermined Abnormal ECG When compared with ECG of 01-Jun-2007 05:06, PREVIOUS ECG IS PRESENT Since last tracing QRS is more prolonged than prior Confirmed by Eber Hong ([id number]) on 05/16/2023 10:41:46 PM  Radiology No results found.  Procedures Procedures  {Document cardiac monitor, telemetry assessment procedure when appropriate:1}  Medications Ordered in ED Medications  aspirin chewable tablet 324 mg (has no administration in time range)    ED Course/ Medical Decision Making/ A&P   {   Click here for ABCD2, HEART and other calculatorsREFRESH Note before signing :1}                              Medical Decision Making Amount and/or Complexity of Data Reviewed Labs: ordered.  Decision-making details documented in ED Course. Radiology: ordered and independent interpretation performed. Decision-making details documented in ED Course. ECG/medicine tests: ordered and independent interpretation performed. Decision-making details documented in ED Course.  Risk OTC drugs.   Episodes Left rib pain rating to his left arm since yesterday.  Not exertional or pleuritic.  EKG does show right bundle branch block and stable ST depressions laterally.  {Document critical care time when appropriate:1} {Document review of labs and clinical decision tools ie heart score, Chads2Vasc2 etc:1}  {Document  your independent review of radiology images, and any outside records:1} {Document your discussion with family members, caretakers, and with consultants:1} {Document social determinants of health affecting pt's care:1} {Document your decision making why or why not admission, treatments were needed:1} Final Clinical Impression(s) / ED Diagnoses Final diagnoses:  None    Rx / DC Orders ED Discharge Orders     None

## 2023-05-16 NOTE — ED Triage Notes (Signed)
 Pt was seen [facility] for left arm pain and upper back pain that has been intermittent since Yesterday. No SOB/Dizziness. Urgent care sts concern abnormal EKG and was sent to ED for further evaluation. Pt denies any cardiac hx.

## 2023-05-16 NOTE — ED Provider Triage Note (Signed)
 Emergency Medicine Provider Triage Evaluation Note  Asaad Gulley , a 63 y.o. male  was evaluated in triage.  Pt complains of chest pain.  Review of Systems  Positive:  Negative:   Physical Exam  BP (!) 159/98 (BP Location: Right Arm)   Pulse 72   Temp (!) 97.3 F (36.3 C)   Resp 16   Ht 5\' 10"  (1.778 m)   Wt 103 kg   SpO2 99%   BMI 32.57 kg/m  Gen:   Awake, no distress   Resp:  Normal effort  MSK:   Moves extremities without difficulty  Other:    Medical Decision Making  Medically screening exam initiated at 9:33 PM.  Appropriate orders placed.  Philmore Lepore was informed that the remainder of the evaluation will be completed by another provider, this initial triage assessment does not replace that evaluation, and the importance of remaining in the ED until their evaluation is complete.  Intermittent left sided chest pain/back pain. Patient sent here from [facility] for concerning EKG. Patient denies cardiac hx/   Dorthy Cooler, [place] 05/16/23 2133

## 2023-05-17 ENCOUNTER — Encounter (HOSPITAL_COMMUNITY): Payer: Self-pay | Admitting: Family Medicine

## 2023-05-17 ENCOUNTER — Observation Stay (HOSPITAL_COMMUNITY)

## 2023-05-17 DIAGNOSIS — R1013 Epigastric pain: Secondary | ICD-10-CM

## 2023-05-17 DIAGNOSIS — R079 Chest pain, unspecified: Secondary | ICD-10-CM | POA: Diagnosis not present

## 2023-05-17 DIAGNOSIS — E119 Type 2 diabetes mellitus without complications: Secondary | ICD-10-CM | POA: Diagnosis not present

## 2023-05-17 DIAGNOSIS — E1169 Type 2 diabetes mellitus with other specified complication: Secondary | ICD-10-CM | POA: Diagnosis not present

## 2023-05-17 DIAGNOSIS — E1159 Type 2 diabetes mellitus with other circulatory complications: Secondary | ICD-10-CM | POA: Diagnosis not present

## 2023-05-17 DIAGNOSIS — E785 Hyperlipidemia, unspecified: Secondary | ICD-10-CM

## 2023-05-17 DIAGNOSIS — I152 Hypertension secondary to endocrine disorders: Secondary | ICD-10-CM

## 2023-05-17 LAB — NM MYOCAR MULTI W/SPECT W/WALL MOTION / EF
Estimated workload: 1
Exercise duration (min): 7 min
Exercise duration (sec): 57 s
LV dias vol: 91 mL (ref 62–150)
MPHR: 156 {beats}/min
Nuc Stress EF: 69 %
Peak HR: 130 {beats}/min
Percent HR: 83 %
Rest HR: 62 {beats}/min
Rest Nuclear Isotope Dose: 10.9 mCi
ST Depression (mm): 0 mm
Stress Nuclear Isotope Dose: 30.8 mCi
TID: 0.83

## 2023-05-17 LAB — CBG MONITORING, ED
Glucose-Capillary: 103 mg/dL — ABNORMAL HIGH (ref 70–99)
Glucose-Capillary: 98 mg/dL (ref 70–99)
Glucose-Capillary: 101 mg/dL — ABNORMAL HIGH (ref 70–99)

## 2023-05-17 LAB — GLUCOSE, CAPILLARY
Glucose-Capillary: 113 mg/dL — ABNORMAL HIGH (ref 70–99)
Glucose-Capillary: 98 mg/dL (ref 70–99)

## 2023-05-17 LAB — LIPID PANEL
Cholesterol: 117 mg/dL (ref 0–200)
HDL: 37 mg/dL — ABNORMAL LOW (ref >40)
LDL Cholesterol: 66 mg/dL (ref 0–99)
Total CHOL/HDL Ratio: 3.2 ratio
Triglycerides: 72 mg/dL (ref <150)
VLDL: 14 mg/dL (ref 0–40)

## 2023-05-17 LAB — HEMOGLOBIN A1C
Hgb A1c MFr Bld: 6.6 % — ABNORMAL HIGH (ref 4.8–5.6)
Mean Plasma Glucose: 142.72 mg/dL

## 2023-05-17 LAB — HIV ANTIBODY (ROUTINE TESTING W REFLEX): HIV Screen 4th Generation wRfx: NONREACTIVE

## 2023-05-17 LAB — TROPONIN I (HIGH SENSITIVITY): Troponin I (High Sensitivity): <2 ng/L (ref <18)

## 2023-05-17 LAB — D-DIMER, QUANTITATIVE: D-Dimer, Quant: 0.36 ug{FEU}/mL (ref 0.00–0.50)

## 2023-05-17 MED ORDER — VALSARTAN-HYDROCHLOROTHIAZIDE 160-12.5 MG PO TABS: 1.0000 | ORAL_TABLET | Freq: Every day | ORAL | Status: DC

## 2023-05-17 MED ORDER — IRBESARTAN 150 MG PO TABS
150.0000 mg | ORAL_TABLET | Freq: Every day | ORAL | Status: DC
  Administered 2023-05-17 – 2023-05-18 (×2): 150 mg via ORAL
  Filled 2023-05-17 (×2): qty 1

## 2023-05-17 MED ORDER — NITROGLYCERIN 0.4 MG SL SUBL
0.4000 mg | SUBLINGUAL_TABLET | SUBLINGUAL | Status: DC | PRN
  Administered 2023-05-17: 0.4 mg via SUBLINGUAL

## 2023-05-17 MED ORDER — ATORVASTATIN CALCIUM 40 MG PO TABS
40.0000 mg | ORAL_TABLET | Freq: Every day | ORAL | Status: DC
  Administered 2023-05-17 – 2023-05-18 (×2): 40 mg via ORAL
  Filled 2023-05-17 (×2): qty 1

## 2023-05-17 MED ORDER — REGADENOSON 0.4 MG/5ML IV SOLN
0.4000 mg | Freq: Once | INTRAVENOUS | Status: AC
Start: 2023-05-17 — End: 2023-05-17
  Administered 2023-05-17: 0.4 mg via INTRAVENOUS

## 2023-05-17 MED ORDER — PANTOPRAZOLE SODIUM 40 MG PO TBEC
40.0000 mg | DELAYED_RELEASE_TABLET | Freq: Every day | ORAL | Status: DC
  Administered 2023-05-17 – 2023-05-18 (×2): 40 mg via ORAL
  Filled 2023-05-17 (×2): qty 1

## 2023-05-17 MED ORDER — TECHNETIUM TC 99M TETROFOSMIN IV KIT
30.8000 | PACK | Freq: Once | INTRAVENOUS | Status: AC | PRN
Start: 2023-05-17 — End: 2023-05-17
  Administered 2023-05-17: 30.8 via INTRAVENOUS

## 2023-05-17 MED ORDER — REGADENOSON 0.4 MG/5ML IV SOLN
INTRAVENOUS | Status: AC
  Filled 2023-05-17: qty 5

## 2023-05-17 MED ORDER — HYDROCHLOROTHIAZIDE 12.5 MG PO TABS
12.5000 mg | ORAL_TABLET | Freq: Every day | ORAL | Status: DC
  Administered 2023-05-17 – 2023-05-18 (×2): 12.5 mg via ORAL
  Filled 2023-05-17 (×2): qty 1

## 2023-05-17 MED ORDER — ENOXAPARIN SODIUM 40 MG/0.4ML IJ SOSY
40.0000 mg | PREFILLED_SYRINGE | INTRAMUSCULAR | Status: DC
  Filled 2023-05-17 (×2): qty 0.4

## 2023-05-17 MED ORDER — INSULIN ASPART 100 UNIT/ML IJ SOLN: 0.0000 [IU] | INTRAMUSCULAR | Status: DC

## 2023-05-17 MED ORDER — ONDANSETRON HCL 4 MG/2ML IJ SOLN: 4.0000 mg | Freq: Four times a day (QID) | INTRAMUSCULAR | Status: DC | PRN

## 2023-05-17 MED ORDER — NITROGLYCERIN 0.4 MG SL SUBL
SUBLINGUAL_TABLET | SUBLINGUAL | Status: AC
  Filled 2023-05-17: qty 1

## 2023-05-17 MED ORDER — OXYCODONE HCL 5 MG PO TABS: 5.0000 mg | ORAL_TABLET | ORAL | Status: DC | PRN

## 2023-05-17 MED ORDER — ACETAMINOPHEN 325 MG PO TABS: 650.0000 mg | ORAL_TABLET | ORAL | Status: DC | PRN

## 2023-05-17 MED ORDER — TECHNETIUM TC 99M TETROFOSMIN IV KIT
10.9000 | PACK | Freq: Once | INTRAVENOUS | Status: AC | PRN
  Administered 2023-05-17: 10.9 via INTRAVENOUS

## 2023-05-17 NOTE — Progress Notes (Signed)
     Michael Mitchell presented for a lexiscan nuclear stress test today.  At approximately minute 2 of the stress portion of the exam, patient had onset/crescendo of typical chest pain, up to 10/10. This was accompanied by diaphoresis and left arm numbness. Patient was given sublingual nitroglycerin 0.4mg  x1 dose with rapid relief of symptoms. By min 2 of recovery, pain had completely resolved. Patient was given crackles and a few sips of Coke and appeared comfortable at time of transfer to post-stress imaging. Stress imaging is pending at this time.  Preliminary EKG findings may be listed in the chart, but the stress test result will not be finalized until perfusion imaging is complete.  Perlie Gold, PA-C  05/17/2023, 1:19 PM

## 2023-05-17 NOTE — Progress Notes (Signed)
 Pt arrived from ..ED..., A/ox .4.Marland Kitchenpt denies any pain, MD aware,CCMD called. CHG bath given,no further needs at this time

## 2023-05-17 NOTE — Progress Notes (Signed)
 Patient admitted earlier this morning.  Patient seen and examined.  H&P reviewed.  Denies any chest pain currently.  Works in the Chief Operating Officer driving at truck.  Vital signs reviewed. Lungs are clear to auscultation bilaterally. S1-S2 is normal regular.  No significant murmurs appreciated. Abdomen is soft.  Nontender nondistended.  Troponins were negative x 2.  LDL is 66.  CT angiogram was negative for dissection.  D-dimer was normal.  Case was discussed with cardiology and a stress test has been ordered.  Await results.  Other issues as per H&P.  Continue home medications for now.  Michael Mitchell 05/17/2023

## 2023-05-17 NOTE — H&P (Signed)
 History and Physical    Michael Mitchell AVW:[id number] DOB: November 30, 1960 DOA: 05/16/2023  PCP: Marykay Lex, MD   Patient coming from: Home   Chief Complaint: Chest pain   HPI: Michael Mitchell is a 63 y.o. male with medical history significant for hypertension, hyperlipidemia, and type 2 diabetes mellitus, now presenting with chest pain.  Patient reports that he woke the morning of 05/15/2023 with pain involving the left lateral chest wall near the axilla.  The pain eventually resolved within a couple hours.  He did not notice any change in his symptoms with activity, movement, deep breath, or cough.  He denies any associated shortness of breath, nausea, or diaphoresis.  He had never experienced this type of pain previously.  ED Course: Upon arrival to the ED, patient is found to be afebrile and saturating well on room air with normal heart rate and elevated blood pressure.  EKG demonstrates sinus rhythm with RBBB.  Labs are most notable for normal creatinine, normal WBC, normal troponin x 2, and normal D-dimer.  CTA chest/abdomen/pelvis is negative for PE or other acute abnormality.  Patient was given 324 mg aspirin in the ED.  ED physician discussed the case with cardiology who recommended medical admission for stress test.  Review of Systems:  All other systems reviewed and apart from HPI, are negative.  Past Medical History:  Diagnosis Date   Arthritis    Diabetes mellitus without complication (HCC)    Essential hypertension    On amlodipine 2.5 mg & lisinopril-HCTZ 20-25 mg daily.   Hyperlipidemia LDL goal <100    On Lipitor 40 mg daily    Past Surgical History:  Procedure Laterality Date   None      Social History:   reports that he has never smoked. He has never used smokeless tobacco. He reports that he does not drink alcohol and does not use drugs.  No Known Allergies  Family History  Problem Relation Age of Onset   Diabetes Mellitus II Mother    Hypertension  Mother    Diabetes Mother    Diabetes Mellitus II Father    Hypertension Brother    Pancreatic cancer Brother    Colon cancer Neg Hx    Esophageal cancer Neg Hx    Inflammatory bowel disease Neg Hx    Liver disease Neg Hx    Rectal cancer Neg Hx    Stomach cancer Neg Hx      Prior to Admission medications   Medication Sig Start Date End Date Taking? Authorizing Provider  atorvastatin (LIPITOR) 40 MG tablet TAKE 1 TABLET(40 MG) BY MOUTH DAILY 05/02/21   Georgina Quint, MD  metFORMIN (GLUCOPHAGE) 500 MG tablet TAKE 1 TABLET(500 MG) BY MOUTH DAILY AT 6 PM 05/02/21   Sagardia, Eilleen Kempf, MD  naproxen sodium (ALEVE) 220 MG tablet Take 220 mg by mouth.    [provider]  omeprazole (PRILOSEC) 40 MG capsule Take 1 capsule (40 mg total) by mouth 2 (two) times daily. 06/25/20   Mansouraty, Netty Starring., MD  sildenafil (VIAGRA) 100 MG tablet Take 1 tablet (100 mg total) by mouth as needed for erectile dysfunction. 01/14/21   Georgina Quint, MD  valsartan-hydrochlorothiazide (DIOVAN-HCT) 160-12.5 MG tablet Take 1 tablet by mouth daily. 04/22/21   Georgina Quint, MD    Physical Exam: Vitals:   05/16/23 2114 05/16/23 2114 05/17/23 0000  BP:  (!) 159/98 (!) 153/93  Pulse:  72 65  Resp:  16 18  Temp:  Marland Kitchen)  97.3 F (36.3 C)   SpO2:  99% 97%  Weight: 103 kg    Height: 5\' 10"  (1.778 m)      Constitutional: NAD, calm  Eyes: PERTLA, lids and conjunctivae normal ENMT: Mucous membranes are moist. Posterior pharynx clear of any exudate or lesions.   Neck: supple, no masses  Respiratory: no wheezing, no crackles. No accessory muscle use.  Cardiovascular: S1 & S2 heard, regular rate and rhythm. No extremity edema.   Abdomen: No tenderness, soft. Bowel sounds active.  Musculoskeletal: no clubbing / cyanosis. No joint deformity upper and lower extremities.   Skin: no significant rashes, lesions, ulcers. Warm, dry, well-perfused. Neurologic: CN 2-12 grossly intact. Moving  all extremities. Alert and oriented.  Psychiatric: Pleasant. Cooperative.    Labs and Imaging on Admission: I have personally reviewed following labs and imaging studies  CBC: Recent Labs  Lab 05/16/23 2141  WBC 8.7  HGB 16.1  HCT 48.1  MCV 85.6  PLT 167   Basic Metabolic Panel: Recent Labs  Lab 05/16/23 2141  NA 138  K 3.6  CL 104  CO2 18*  GLUCOSE 111*  BUN 22  CREATININE 0.94  CALCIUM 8.9   GFR: Estimated Creatinine Clearance: 95.4 mL/min (by C-G formula based on SCr of 0.94 mg/dL). Liver Function Tests: No results for input(s): "AST", "ALT", "ALKPHOS", "BILITOT", "PROT", "ALBUMIN" in the last 168 hours. No results for input(s): "LIPASE", "AMYLASE" in the last 168 hours. No results for input(s): "AMMONIA" in the last 168 hours. Coagulation Profile: No results for input(s): "INR", "PROTIME" in the last 168 hours. Cardiac Enzymes: No results for input(s): "CKTOTAL", "CKMB", "CKMBINDEX", "TROPONINI" in the last 168 hours. BNP (last 3 results) No results for input(s): "PROBNP" in the last 8760 hours. HbA1C: No results for input(s): "HGBA1C" in the last 72 hours. CBG: No results for input(s): "GLUCAP" in the last 168 hours. Lipid Profile: No results for input(s): "CHOL", "HDL", "LDLCALC", "TRIG", "CHOLHDL", "LDLDIRECT" in the last 72 hours. Thyroid Function Tests: No results for input(s): "TSH", "T4TOTAL", "FREET4", "T3FREE", "THYROIDAB" in the last 72 hours. Anemia Panel: No results for input(s): "VITAMINB12", "FOLATE", "FERRITIN", "TIBC", "IRON", "RETICCTPCT" in the last 72 hours. Urine analysis: No results found for: "COLORURINE", "APPEARANCEUR", "LABSPEC", "PHURINE", "GLUCOSEU", "HGBUR", "BILIRUBINUR", "KETONESUR", "PROTEINUR", "UROBILINOGEN", "NITRITE", "LEUKOCYTESUR" Sepsis Labs: @LABRCNTIP (procalcitonin:4,lacticidven:4) )No results found for this or any previous visit (from the past 240 hours).   Radiological Exams on Admission: CT Angio Chest/Abd/Pel  for Dissection W and/or Wo Contrast Result Date: 05/17/2023 CLINICAL DATA:  Acute aortic syndrome, back pain EXAM: CT ANGIOGRAPHY CHEST, ABDOMEN AND PELVIS TECHNIQUE: Non-contrast CT of the chest was initially obtained. Multidetector CT imaging through the chest, abdomen and pelvis was performed using the standard protocol during bolus administration of intravenous contrast. Multiplanar reconstructed images and MIPs were obtained and reviewed to evaluate the vascular anatomy. RADIATION DOSE REDUCTION: This exam was performed according to the departmental dose-optimization program which includes automated exposure control, adjustment of the mA and/or kV according to patient size and/or use of iterative reconstruction technique. CONTRAST:  OMNIPAQUE IOHEXOL 350 MG/ML SOLN COMPARISON:  None Available. FINDINGS: CTA CHEST FINDINGS Cardiovascular: Mild cardiomegaly. Mild global cardiomegaly. No pericardial effusion. Central pulmonary arteries are of normal caliber. The thoracic aorta is normal in course and caliber. No intramural hematoma, dissection, or aneurysm. Arch vasculature demonstrates classic anatomic configuration and is widely patent proximally. Mild atherosclerotic calcification. Mediastinum/Nodes: No enlarged mediastinal, hilar, or axillary lymph nodes. Thyroid gland, trachea, and esophagus demonstrate no significant findings.  Lungs/Pleura: Lungs are clear. No pleural effusion or pneumothorax. Musculoskeletal: No chest wall abnormality. No acute or significant osseous findings. Review of the MIP images confirms the above findings. CTA ABDOMEN AND PELVIS FINDINGS VASCULAR Aorta: Normal caliber aorta without aneurysm, dissection, vasculitis or significant stenosis. Celiac: Patent without evidence of aneurysm, dissection, vasculitis or significant stenosis. SMA: Patent without evidence of aneurysm, dissection, vasculitis or significant stenosis. Renals: Both renal arteries are patent without evidence of  aneurysm, dissection, vasculitis, fibromuscular dysplasia or significant stenosis. IMA: Patent without evidence of aneurysm, dissection, vasculitis or significant stenosis. Inflow: Patent without evidence of aneurysm, dissection, vasculitis or significant stenosis. Veins: No obvious venous abnormality within the limitations of this arterial phase study. Review of the MIP images confirms the above findings. NON-VASCULAR Hepatobiliary: No focal liver abnormality is seen. No gallstones, gallbladder wall thickening, or biliary dilatation. Pancreas: Unremarkable Spleen: Unremarkable Adrenals/Urinary Tract: Adrenal glands are unremarkable. Kidneys are normal, without renal calculi, focal lesion, or hydronephrosis. Bladder is unremarkable. Stomach/Bowel: Moderate sigmoid diverticulosis. Stomach, small bowel, and large bowel are otherwise unremarkable. Appendix normal. No evidence of obstruction or focal inflammation. No free intraperitoneal gas or fluid. Lymphatic: No pathologic adenopathy Reproductive: Moderate prostatic hypertrophy Other: No abdominal wall hernia or abnormality. No abdominopelvic ascites. Musculoskeletal: No acute or significant osseous findings. Review of the MIP images confirms the above findings. IMPRESSION: 1. No acute intrathoracic or intra-abdominal pathology identified. No aortic aneurysm or dissection. 2. Mild cardiomegaly. 3. Moderate sigmoid diverticulosis without superimposed acute inflammatory change. 4. Moderate prostatic hypertrophy. Aortic Atherosclerosis (ICD10-I70.0). Electronically Signed   By: Helyn Numbers M.D.   On: 05/17/2023 00:04   DG Chest 2 View Result Date: 05/16/2023 CLINICAL DATA:  Chest pain EXAM: CHEST - 2 VIEW COMPARISON:  None Available. FINDINGS: Lungs are well expanded, symmetric, and clear. No pneumothorax or pleural effusion. Cardiac size is mildly enlarged. Pulmonary vascularity is normal. Osseous structures are age-appropriate. No acute bone abnormality.  IMPRESSION: 1. Mild cardiomegaly. Electronically Signed   By: Helyn Numbers M.D.   On: 05/16/2023 23:51    EKG: Independently reviewed. Sinus rhythm, RBBB.   Assessment/Plan  1. Chest pain  - In ED, he is asymptomatic, HS troponin is normal x2, D-dimer is normal, and there are no acute findings on CTA chest/abd/pelvis  - Continue cardiac monitoring, repeat EKG for recurrent chest pain, continue ASA and Lipitor, and stress test (pharmacologic stress test ordered by cardiology)   2. Hypertension  - Continue Diovan   3. HLD  - Lipitor    4. Type II DM  - A1c was 7.2% in February 2023  - Hold metformin for now, check CBGs, and use low-intensity SSI if needed    DVT prophylaxis: Lovenox  Code Status: Full  Level of Care: Level of care: Telemetry Cardiac Family Communication: Son at bedside  Disposition Plan:  Patient is from: Home  Anticipated d/c is to: Home  Anticipated d/c date is: 3/12 or 05/18/23  Patient currently: Pending stress test  Consults called: None  Admission status: Observation     Briscoe Deutscher, MD [facility]  05/17/2023, 1:29 AM

## 2023-05-17 NOTE — Consult Note (Addendum)
 Cardiology Consultation   Patient ID: Michael Mitchell MRN: [id number]; DOB: October 23, 1960  Admit date: 05/16/2023 Date of Consult: 05/17/2023  PCP:  Marykay Lex, MD    [facility] Providers Cardiologist:  Bryan Lemma, MD        Patient Profile:   Michael Mitchell is a 63 y.o. adult with a hx of hypertension, hyperlipidemia, DM type II, intermittent chest pain who is being seen 05/17/2023 for the evaluation of symptomatic stress test.  History of Present Illness:   Michael Mitchell presented to the ED on 3/11 for evaluation of chest discomfort that began upon wakening on 05/15/23. He originally presented to urgent care and was sent to the ED. Pain seemingly lasted throughout the day on Monday and was located left lateral chest wall near axilla with radiation to left arm, associated with a tingling sensation. Pain eventually resolved spontaneously yesterday. Did not associate discomfort with physical activity, specific movements. EKG with RBBB, stable appearing non-specific TWI in inferior/precordial leads. In the ED, labs reassuring with negative troponin x2.  Given symptoms concerning for cardiac etiology, appears the admitting hospital team discussed with overnight cardiology fellow and ultimately a Lexiscan stress test was ordered.  I proctored the stress test today.  During the stress portion of the test, patient became acutely diaphoretic with visible shortness of breath, and severe substernal chest pain reported, 10 out of 10.  Patient became tearful the pain was so significant.  Sublingual nitroglycerin was given and patient had resolution of his symptoms within 5 minutes of administration.  Interestingly patient described that the symptoms prompting his visit to the emergency department for not at all the same as those experienced during the stress test.  He said that the pain during the stress test was unlike anything he ever felt before.  Patient is not particularly active at  baseline, is not sure that he has gotten his heart rate elevated to the degree that it was during the stress test today.  He does intermittently go on walks and denies any exertional limitation or cardiac symptoms while doing so.  We discussed patient's family medical history, he reports a brother who he believes has recently had a heart attack.  Patient has no history of smoking, does not drink.   Of note, patient previously seen by Dr. Herbie Baltimore in 2020 with chest pain similar to current presentation. Workup at that time reassuring with low risk stress test and TTE with low normal LVEF and normal regional wall motion.   Past Medical History:  Diagnosis Date   Arthritis    Diabetes mellitus without complication (HCC)    Essential hypertension    On amlodipine 2.5 mg & lisinopril-HCTZ 20-25 mg daily.   Hyperlipidemia LDL goal <100    On Lipitor 40 mg daily    Past Surgical History:  Procedure Laterality Date   None       Home Medications:  Prior to Admission medications   Medication Sig Start Date End Date Taking? Authorizing Provider  atorvastatin (LIPITOR) 40 MG tablet TAKE 1 TABLET(40 MG) BY MOUTH DAILY 05/02/21  Yes Sagardia, Eilleen Kempf, MD  JARDIANCE 10 MG TABS tablet Take 10 mg by mouth daily. 03/21/23  Yes [provider]  metFORMIN (GLUCOPHAGE) 500 MG tablet TAKE 1 TABLET(500 MG) BY MOUTH DAILY AT 6 PM 05/02/21  Yes Sagardia, Eilleen Kempf, MD  Multiple Vitamins-Minerals (MULTIVITAMIN MEN 50+) TABS Take 1 tablet by mouth daily.   Yes [provider]  naproxen sodium (ALEVE) 220 MG  tablet Take 440 mg by mouth as needed (headache,).   Yes [provider]  omeprazole (PRILOSEC) 40 MG capsule Take 1 capsule (40 mg total) by mouth 2 (two) times daily. Patient taking differently: Take 40 mg by mouth 2 (two) times daily as needed. 06/25/20  Yes Mansouraty, Netty Starring., MD  sildenafil (VIAGRA) 100 MG tablet Take 1 tablet (100 mg total) by mouth as needed for  erectile dysfunction. 01/14/21  Yes Sagardia, Eilleen Kempf, MD  valsartan-hydrochlorothiazide (DIOVAN-HCT) 320-25 MG tablet Take 1 tablet by mouth daily. 04/12/23  Yes [provider]    Inpatient Medications: Scheduled Meds:  atorvastatin  40 mg Oral Daily   enoxaparin (LOVENOX) injection  40 mg Subcutaneous Q24H   irbesartan  150 mg Oral Daily   And   hydrochlorothiazide  12.5 mg Oral Daily   insulin aspart  0-6 Units Subcutaneous Q4H   pantoprazole  40 mg Oral Daily   Continuous Infusions:  sodium chloride     PRN Meds: acetaminophen, nitroGLYCERIN, ondansetron (ZOFRAN) IV, oxyCODONE  Allergies:   No Known Allergies  Social History:   Social History   Socioeconomic History   Marital status: Married    Spouse name: Not on file   Number of children: Not on file   Years of education: Not on file   Highest education level: Not on file  Occupational History   Not on file  Tobacco Use   Smoking status: Never   Smokeless tobacco: Never  Substance and Sexual Activity   Alcohol use: Never   Drug use: Never   Sexual activity: Yes  Other Topics Concern   Not on file  Social History Narrative   Father of 2 daughters.   Social Drivers of Corporate investment banker Strain: Not on file  Food Insecurity: No Food Insecurity (05/17/2023)   Hunger Vital Sign    Worried About Running Out of Food in the Last Year: Never true    Ran Out of Food in the Last Year: Never true  Transportation Needs: No Transportation Needs (05/17/2023)   PRAPARE - Administrator, Civil Service (Medical): No    Lack of Transportation (Non-Medical): No  Physical Activity: Not on file  Stress: Not on file  Social Connections: Not on file  Intimate Partner Violence: Not At Risk (05/17/2023)   Humiliation, Afraid, Rape, and Kick questionnaire    Fear of Current or Ex-Partner: No    Emotionally Abused: No    Physically Abused: No    Sexually Abused: No    Family History:     Family History  Problem Relation Age of Onset   Diabetes Mellitus II Mother    Hypertension Mother    Diabetes Mother    Diabetes Mellitus II Father    Hypertension Brother    Pancreatic cancer Brother    Colon cancer Neg Hx    Esophageal cancer Neg Hx    Inflammatory bowel disease Neg Hx    Liver disease Neg Hx    Rectal cancer Neg Hx    Stomach cancer Neg Hx      ROS:  Please see the history of present illness.   All other ROS reviewed and negative.     Physical Exam/Data:   Vitals:   05/17/23 1333 05/17/23 1335 05/17/23 1337 05/17/23 1339  BP: (!) 148/86 (!) 142/90 (!) 153/94 (!) 142/88  Pulse: (!) 122 (!) 125 (!) 102 97  Resp:      Temp:  TempSrc:      SpO2:      Weight:      Height:       No intake or output data in the 24 hours ending 05/17/23 1528    05/16/2023    9:14 PM 04/22/2021    4:02 PM 01/14/2021    4:01 PM  Last 3 Weights  Weight (lbs) 227 lb 227 lb 217 lb  Weight (kg) 102.967 kg 102.967 kg 98.431 kg     Body mass index is 32.57 kg/m.  General:  Well nourished, well developed, in no acute distress HEENT: normal Neck: no JVD Vascular: No carotid bruits; Distal pulses 2+ bilaterally Cardiac:  normal S1, S2; RRR; no murmur  Lungs:  clear to auscultation bilaterally, no wheezing, rhonchi or rales  Abd: soft, nontender, no hepatomegaly  Ext: no edema Musculoskeletal:  No deformities, BUE and BLE strength normal and equal Skin: warm and Mitchell  Neuro:  CNs 2-12 intact, no focal abnormalities noted Psych:  Normal affect   EKG:  The EKG was personally reviewed and demonstrates:  sinus rhythm with RBBB, chronic and nonspecific inferior and precordial lead TWI.  Telemetry:  Telemetry was personally reviewed and demonstrates:  sinus rhythm  Relevant CV Studies: Cardiac Studies & Procedures   ______________________________________________________________________________________________   STRESS TESTS  NM MYOCAR MULTI W/SPECT W  05/17/2023  Narrative   LV perfusion is normal. There is no evidence of ischemia. There is no evidence of infarction.   Left ventricular function is normal. Nuclear stress EF: 69%. The left ventricular ejection fraction is hyperdynamic (>65%). End diastolic cavity size is normal.   The study is normal. The study is low risk.   ECHOCARDIOGRAM  ECHOCARDIOGRAM COMPLETE 08/07/2018  Narrative ECHOCARDIOGRAM REPORT    Patient Name:   GLENDA KUNST Date of Exam: 08/07/2018 Medical Rec #:  [id number]       Height:       70.0 in Accession #:    [id number]      Weight:       217.8 lb Date of Birth:  Jun 11, 1960       BSA:          2.16 m Patient Age:    57 years        BP:           130/72 mmHg Patient Gender: M               HR:           65 bpm. Exam Location:  [address]   Procedure: 2D Echo, 3D Echo, Cardiac Doppler and Color Doppler  Indications:    R94.31 Abnormal EKG R06.02 Shortness of Breath  History:        Patient has no prior history of Echocardiogram examinations. Signs/Symptoms: Chest Pain and Shortness of Breath Risk Factors: Hypertension and Diabetes.  Sonographer:    Farrel Conners RDCS Referring Phys: 32 Stephene Alegria W Jamela Cumbo  IMPRESSIONS   1. The left ventricle has low normal systolic function, with an ejection fraction of 50-55%. The cavity size was normal. Left ventricular diastolic Doppler parameters are consistent with impaired relaxation. 2. The right ventricle has normal systolic function. The cavity was normal. There is no increase in right ventricular wall thickness.  SUMMARY  Poor endocardial visualization, additional imaging with Definity echocontrast is recommended. FINDINGS Left Ventricle: The left ventricle has low normal systolic function, with an ejection fraction of 50-55%. The cavity size was normal. There is no increase in left  ventricular wall thickness. Left ventricular diastolic Doppler parameters are consistent with impaired relaxation.  Normal left ventricular filling pressures  Right Ventricle: The right ventricle has normal systolic function. The cavity was normal. There is no increase in right ventricular wall thickness.  Left Atrium: Left atrial size was normal in size.  Right Atrium: Right atrial size was normal in size. Right atrial pressure is estimated at 3 mmHg.  Interatrial Septum: No atrial level shunt detected by color flow Doppler.  Pericardium: There is no evidence of pericardial effusion.  Mitral Valve: The mitral valve is normal in structure. Mitral valve regurgitation is not visualized by color flow Doppler.  Tricuspid Valve: The tricuspid valve is normal in structure. Tricuspid valve regurgitation is trivial by color flow Doppler.  Aortic Valve: The aortic valve is normal in structure. Aortic valve regurgitation was not visualized by color flow Doppler.  Pulmonic Valve: The pulmonic valve was normal in structure. Pulmonic valve regurgitation was not assessed by color flow Doppler.  Venous: The inferior vena cava is normal in size with greater than 50% respiratory variability.   +--------------+--------++ LEFT VENTRICLE         +----------------+---------++ +--------------+--------++ Diastology                PLAX 2D                +----------------+---------++ +--------------+--------++ LV e' lateral:  6.31 cm/s LVIDd:        4.03 cm  +----------------+---------++ +--------------+--------++ LV E/e' lateral:7.9       LVIDs:        2.47 cm  +----------------+---------++ +--------------+--------++ LV e' medial:   5.33 cm/s LV PW:        0.96 cm  +----------------+---------++ +--------------+--------++ LV E/e' medial: 9.4       LV IVS:       1.11 cm  +----------------+---------++ +--------------+--------++ LVOT diam:    2.50 cm  +--------------+--------++ LV SV:        50 ml    +--------------+--------++ LV SV Index:  22.17     +--------------+--------++ LVOT Area:    4.91 cm +--------------+--------++                        +--------------+--------++  +---------------+----------++ RIGHT VENTRICLE           +---------------+----------++ RV S prime:    10.60 cm/s +---------------+----------++ TAPSE (M-mode):1.9 cm     +---------------+----------++  +---------------+-------++-----------++ LEFT ATRIUM           Index       +---------------+-------++-----------++ LA diam:       3.40 cm1.57 cm/m  +---------------+-------++-----------++ LA Vol (A2C):  58.6 ml27.08 ml/m +---------------+-------++-----------++ LA Vol (A4C):  56.1 ml25.92 ml/m +---------------+-------++-----------++ LA Biplane Vol:56.7 ml26.20 ml/m +---------------+-------++-----------++ +------------+---------++-----------++ RIGHT ATRIUM         Index       +------------+---------++-----------++ RA Area:    21.30 cm            +------------+---------++-----------++ RA Volume:  65.90 ml 30.45 ml/m +------------+---------++-----------++ +------------+-----------++ AORTIC VALVE            +------------+-----------++ LVOT Vmax:  98.30 cm/s  +------------+-----------++ LVOT Vmean: 65.900 cm/s +------------+-----------++ LVOT VTI:   0.215 m     +------------+-----------++  +-------------+-------++ AORTA                +-------------+-------++ Ao Root diam:2.90 cm +-------------+-------++ Ao Asc diam: 3.40 cm +-------------+-------++  +--------------+--------++ MITRAL VALVE             +--------------+-------+ +--------------+--------++  SHUNTS                MV Area (PHT):cm        +--------------+-------+ +--------------+--------++   Systemic VTI: 0.22 m  MV PHT:       msec       +--------------+-------+ +--------------+--------++   Systemic Diam:2.50 cm MV Decel Time:303 msec    +--------------+-------+ +--------------+--------++ +--------------+----------++ MV E velocity:50.00 cm/s +--------------+----------++ MV A velocity:82.90 cm/s +--------------+----------++ MV E/A ratio: 0.60       +--------------+----------++   Tobias Alexander MD Electronically signed by Tobias Alexander MD Signature Date/Time: 08/07/2018/9:30:13 PM    Final          ______________________________________________________________________________________________       Laboratory Data:  High Sensitivity Troponin:   Recent Labs  Lab 05/16/23 2141 05/16/23 2335  TROPONINIHS 3 <2     Chemistry Recent Labs  Lab 05/16/23 2141  NA 138  K 3.6  CL 104  CO2 18*  GLUCOSE 111*  BUN 22  CREATININE 0.94  CALCIUM 8.9  GFRNONAA >60  ANIONGAP 16*    No results for input(s): "PROT", "ALBUMIN", "AST", "ALT", "ALKPHOS", "BILITOT" in the last 168 hours. Lipids  Recent Labs  Lab 05/17/23 0530  CHOL 117  TRIG 72  HDL 37*  LDLCALC 66  CHOLHDL 3.2    Hematology Recent Labs  Lab 05/16/23 2141  WBC 8.7  RBC 5.62  HGB 16.1  HCT 48.1  MCV 85.6  MCH 28.6  MCHC 33.5  RDW 13.1  PLT 167   Thyroid No results for input(s): "TSH", "FREET4" in the last 168 hours.  BNPNo results for input(s): "BNP", "PROBNP" in the last 168 hours.  DDimer  Recent Labs  Lab 05/16/23 2328  DDIMER 0.36     Radiology/Studies:  NM Myocar Multi W/Spect W/Wall Motion / EF Result Date: 05/17/2023   LV perfusion is normal. There is no evidence of ischemia. There is no evidence of infarction.   Left ventricular function is normal. Nuclear stress EF: 69%. The left ventricular ejection fraction is hyperdynamic (>65%). End diastolic cavity size is normal.   The study is normal. The study is low risk.   CT Angio Chest/Abd/Pel for Dissection W and/or Wo Contrast Result Date: 05/17/2023 CLINICAL DATA:  Acute aortic syndrome, back pain EXAM: CT ANGIOGRAPHY CHEST, ABDOMEN AND PELVIS  TECHNIQUE: Non-contrast CT of the chest was initially obtained. Multidetector CT imaging through the chest, abdomen and pelvis was performed using the standard protocol during bolus administration of intravenous contrast. Multiplanar reconstructed images and MIPs were obtained and reviewed to evaluate the vascular anatomy. RADIATION DOSE REDUCTION: This exam was performed according to the departmental dose-optimization program which includes automated exposure control, adjustment of the mA and/or kV according to patient size and/or use of iterative reconstruction technique. CONTRAST:  OMNIPAQUE IOHEXOL 350 MG/ML SOLN COMPARISON:  None Available. FINDINGS: CTA CHEST FINDINGS Cardiovascular: Mild cardiomegaly. Mild global cardiomegaly. No pericardial effusion. Central pulmonary arteries are of normal caliber. The thoracic aorta is normal in course and caliber. No intramural hematoma, dissection, or aneurysm. Arch vasculature demonstrates classic anatomic configuration and is widely patent proximally. Mild atherosclerotic calcification. Mediastinum/Nodes: No enlarged mediastinal, hilar, or axillary lymph nodes. Thyroid gland, trachea, and esophagus demonstrate no significant findings. Lungs/Pleura: Lungs are clear. No pleural effusion or pneumothorax. Musculoskeletal: No chest wall abnormality. No acute or significant osseous findings. Review of the MIP images confirms the above findings. CTA ABDOMEN AND PELVIS FINDINGS VASCULAR Aorta: Normal caliber aorta without aneurysm, dissection,  vasculitis or significant stenosis. Celiac: Patent without evidence of aneurysm, dissection, vasculitis or significant stenosis. SMA: Patent without evidence of aneurysm, dissection, vasculitis or significant stenosis. Renals: Both renal arteries are patent without evidence of aneurysm, dissection, vasculitis, fibromuscular dysplasia or significant stenosis. IMA: Patent without evidence of aneurysm, dissection, vasculitis or  significant stenosis. Inflow: Patent without evidence of aneurysm, dissection, vasculitis or significant stenosis. Veins: No obvious venous abnormality within the limitations of this arterial phase study. Review of the MIP images confirms the above findings. NON-VASCULAR Hepatobiliary: No focal liver abnormality is seen. No gallstones, gallbladder wall thickening, or biliary dilatation. Pancreas: Unremarkable Spleen: Unremarkable Adrenals/Urinary Tract: Adrenal glands are unremarkable. Kidneys are normal, without renal calculi, focal lesion, or hydronephrosis. Bladder is unremarkable. Stomach/Bowel: Moderate sigmoid diverticulosis. Stomach, small bowel, and large bowel are otherwise unremarkable. Appendix normal. No evidence of obstruction or focal inflammation. No free intraperitoneal gas or fluid. Lymphatic: No pathologic adenopathy Reproductive: Moderate prostatic hypertrophy Other: No abdominal wall hernia or abnormality. No abdominopelvic ascites. Musculoskeletal: No acute or significant osseous findings. Review of the MIP images confirms the above findings. IMPRESSION: 1. No acute intrathoracic or intra-abdominal pathology identified. No aortic aneurysm or dissection. 2. Mild cardiomegaly. 3. Moderate sigmoid diverticulosis without superimposed acute inflammatory change. 4. Moderate prostatic hypertrophy. Aortic Atherosclerosis (ICD10-I70.0). Electronically Signed   By: Helyn Numbers M.D.   On: 05/17/2023 00:04   DG Chest 2 View Result Date: 05/16/2023 CLINICAL DATA:  Chest pain EXAM: CHEST - 2 VIEW COMPARISON:  None Available. FINDINGS: Lungs are well expanded, symmetric, and clear. No pneumothorax or pleural effusion. Cardiac size is mildly enlarged. Pulmonary vascularity is normal. Osseous structures are age-appropriate. No acute bone abnormality. IMPRESSION: 1. Mild cardiomegaly. Electronically Signed   By: Helyn Numbers M.D.   On: 05/16/2023 23:51     Assessment and Plan:   Typical and atypical  chest pain As noted above in HPI, patient presented to the emergency department via urgent care for evaluation of atypical left-sided chest discomfort with associated left arm numbness.  Symptoms were not exertionally associated.  Symptoms resolved spontaneously.  Prior echocardiogram and stress testing normal.  ECG this admission showed chronic right bundle branch block with stable and nonspecific T wave inversions.  Troponin negative.  Patient was arranged for a Lexiscan stress test today by admitting team.  During this test patient had very classic anginal symptoms: Substernal and crushing chest discomfort, diaphoresis, tachypnea that was promptly relieved with sublingual nitroglycerin.  However stress images negative for ischemia or infarction. Admittedly unclear situation with mix of typical and atypical chest pain as well as symptomatically failed stress test but with normal stress imaging.  Ideally this patient would have undergone a coronary CTA rather than Lexiscan stress test for evaluation of his atypical chest pain on admission.  He does have risk factors for coronary artery disease including type 2 diabetes, though this appears to be well-controlled.  He has a brother who he believes has had an attack.  No smoking history.  I had extensive discussion with patient and spouse about the possible options for further evaluation. Given this unclear picture, would potentially favor left heart catheterization for definitive assessment of patient's coronary anatomy.  A coronary CTA at this point would seem to be less logical.  Question whether patient's symptoms could have been secondary to coronary vasospasm although medical literature does not support direct cause-and-effect of Lexiscan and vasospasm. Patient and spouse have requested time to consider options, they will speak further with Dr. Herbie Baltimore.  Attending Note: The patient is presenting GI symptoms with negative troponin and prolonged chest pain  although the EKG shows RBBB is not consistent with a ACS presentation.  More interestingly was the symptom that he had while receiving the the Lexiscan infusion, which could simply just be a global reaction to the Lexiscan and may not necessarily mean ischemia based on the fact that the images are all the normal.  => At this point I think having a stress test that is clearly nonischemic with normal EF and atypical chest pain as the in citing symptom that is likely not cardiac in nature, I think it is safe to let the patient be discharged home have close follow-up with Perlie Gold, PA in roughly 1 month to reassess for any recurrence of symptoms.  The left-sided axillary pain is probably not cardiac however this typically had with Lexiscan could be commensurate with a cardiac symptom but he has not had any like that for or since.  Would like to see if he has any symptoms of exertion, and if so would strongly consider progressing to invasive fashion with cardiac catheterization, however if symptoms remain atypical, would potentially consider outpatient Coronary CTA.    Hyperlipidemia LDL this admission 66.   Continue atorvastatin 40 mg  Hypertension Patient on valsartan hydrochlorothiazide 320-25 mg once daily prior to arrival.  This is not been given since his arrival to the emergency department.  Moderate hypertension per vital signs. Recommend resuming valsartan-hydrochlorothiazide.  DM type II Per primary team   Risk Assessment/Risk Scores:     TIMI Risk Score for Unstable Angina or Non-ST Elevation MI:   The patient's TIMI risk score is  , which indicates a  % risk of all cause mortality, new or recurrent myocardial infarction or need for urgent revascularization in the next 14 days.          For questions or updates, please contact [facility] Please consult [email or web address] for contact info under    Signed, Perlie Gold, PA-C  05/17/2023 3:28 PM   ATTENDING  ATTESTATION  I have seen, examined and evaluated the patient this evening after rounds along with Perlie Gold, PA.  After reviewing all the available data and chart, we discussed the patients laboratory, study & physical findings as well as symptoms in detail.  I agree with his findings, examination as well as impression recommendations as per our discussion.    Attending adjustments noted in italics.   Patient presented with somewhat atypical chest pain but difficult chest discomfort noted with Lexiscan infusion but I think may just be related to medication effect.  Prolonged chest pain with negative troponin levels likely not consistent with ACS. Based on the relatively normal symptoms and no further symptoms of chest pain I think it is okay to discharge patient home to see the PA in close follow-up to discuss symptoms.  Could give some glycerin, but as the symptoms Including, not likely necessary.   Will monitor for symptoms but would likely anticipate discharge tomorrow after scheduling close follow-up.   [facility] will sign off.   Medication Recommendations: Consider Sabharwal nitroglycerin prescription but likely not necessary Other recommendations (labs, testing, etc): None Follow up as an outpatient: Will arrange close follow-up with Jerrye Beavers, PA    Marykay Lex, MD, MS Bryan Lemma, M.D., M.S. Interventional Cardiologist  [facility]  Pager # [phone] Phone # [phone] [address] [place], [place] [address]

## 2023-05-18 DIAGNOSIS — R079 Chest pain, unspecified: Secondary | ICD-10-CM | POA: Diagnosis not present

## 2023-05-18 LAB — LIPOPROTEIN A (LPA): Lipoprotein (a): 152.3 nmol/L — ABNORMAL HIGH (ref <75.0)

## 2023-05-18 LAB — GLUCOSE, CAPILLARY
Glucose-Capillary: 126 mg/dL — ABNORMAL HIGH (ref 70–99)
Glucose-Capillary: 121 mg/dL — ABNORMAL HIGH (ref 70–99)

## 2023-05-18 MED ORDER — NITROGLYCERIN 0.4 MG SL SUBL: 0.4000 mg | SUBLINGUAL_TABLET | SUBLINGUAL | 0 refills | Status: AC | PRN

## 2023-05-18 NOTE — Care Management (Signed)
  Transition of Care [facility]) Screening Note   Patient Details  Name: Michael Mitchell Date of Birth: [birth year]   Transition of Care [facility]) CM/SW Contact:    Lockie Pares, RN Phone Number: 05/18/2023, 9:48 AM    Transition of Care Department [facility]) has reviewed patient and no TOC needs have been identified at this time. We will continue to monitor patient advancement through interdisciplinary progression rounds. If new patient transition needs arise, please place a TOC consult.  Patient will DC today

## 2023-05-18 NOTE — Plan of Care (Signed)
  Problem: Education: Goal: Ability to describe self-care measures that may prevent or decrease complications (Diabetes Survival Skills Education) will improve Outcome: Adequate for Discharge Goal: Individualized Educational Video(s) Outcome: Adequate for Discharge   Problem: Coping: Goal: Ability to adjust to condition or change in health will improve Outcome: Adequate for Discharge   Problem: Fluid Volume: Goal: Ability to maintain a balanced intake and output will improve Outcome: Adequate for Discharge   Problem: Health Behavior/Discharge Planning: Goal: Ability to identify and utilize available resources and services will improve Outcome: Adequate for Discharge Goal: Ability to manage health-related needs will improve Outcome: Adequate for Discharge   Problem: Metabolic: Goal: Ability to maintain appropriate glucose levels will improve Outcome: Adequate for Discharge   Problem: Nutritional: Goal: Maintenance of adequate nutrition will improve Outcome: Adequate for Discharge Goal: Progress toward achieving an optimal weight will improve Outcome: Adequate for Discharge   Problem: Skin Integrity: Goal: Risk for impaired skin integrity will decrease Outcome: Adequate for Discharge   Problem: Tissue Perfusion: Goal: Adequacy of tissue perfusion will improve Outcome: Adequate for Discharge   Problem: Education: Goal: Understanding of cardiac disease, CV risk reduction, and recovery process will improve Outcome: Adequate for Discharge Goal: Individualized Educational Video(s) Outcome: Adequate for Discharge   Problem: Activity: Goal: Ability to tolerate increased activity will improve Outcome: Adequate for Discharge   Problem: Cardiac: Goal: Ability to achieve and maintain adequate cardiovascular perfusion will improve Outcome: Adequate for Discharge   Problem: Health Behavior/Discharge Planning: Goal: Ability to safely manage health-related needs after discharge  will improve Outcome: Adequate for Discharge   Problem: Education: Goal: Knowledge of General Education information will improve Description: Including pain rating scale, medication(s)/side effects and non-pharmacologic comfort measures Outcome: Adequate for Discharge   Problem: Health Behavior/Discharge Planning: Goal: Ability to manage health-related needs will improve Outcome: Adequate for Discharge   Problem: Clinical Measurements: Goal: Ability to maintain clinical measurements within normal limits will improve Outcome: Adequate for Discharge Goal: Will remain free from infection Outcome: Adequate for Discharge Goal: Diagnostic test results will improve Outcome: Adequate for Discharge Goal: Respiratory complications will improve Outcome: Adequate for Discharge Goal: Cardiovascular complication will be avoided Outcome: Adequate for Discharge   Problem: Activity: Goal: Risk for activity intolerance will decrease Outcome: Adequate for Discharge   Problem: Nutrition: Goal: Adequate nutrition will be maintained Outcome: Adequate for Discharge   Problem: Coping: Goal: Level of anxiety will decrease Outcome: Adequate for Discharge   Problem: Elimination: Goal: Will not experience complications related to bowel motility Outcome: Adequate for Discharge Goal: Will not experience complications related to urinary retention Outcome: Adequate for Discharge   Problem: Pain Managment: Goal: General experience of comfort will improve and/or be controlled Outcome: Adequate for Discharge   Problem: Safety: Goal: Ability to remain free from injury will improve Outcome: Adequate for Discharge   Problem: Skin Integrity: Goal: Risk for impaired skin integrity will decrease Outcome: Adequate for Discharge

## 2023-05-18 NOTE — Discharge Summary (Signed)
 [facility]  Physician Discharge Summary   Patient ID: Michael Mitchell MRN: [id number] DOB/AGE: January 31, 1961 63 y.o.  Admit date: 05/16/2023 Discharge date: 05/18/2023    PCP: Marykay Lex, MD  DISCHARGE DIAGNOSES:    Chest pain   Dyslipidemia associated with type 2 diabetes mellitus (HCC)   Hypertension associated with diabetes (HCC)   Diabetes mellitus without complication (HCC)   RECOMMENDATIONS FOR OUTPATIENT FOLLOW UP: Cardiology to arrange Outpatient f/u    Home Health:None  Equipment/Devices:None   CODE STATUS:Full code   DISCHARGE CONDITION: fair  Diet recommendation: Heart healthy  INITIAL HISTORY: 63 y.o. male with medical history significant for hypertension, hyperlipidemia, and type 2 diabetes mellitus, now presenting with chest pain. Patient reports that he woke the morning of 05/15/2023 with pain involving the left lateral chest wall near the axilla.  The pain eventually resolved within a couple hours.  He did not notice any change in his symptoms with activity, movement, deep breath, or cough.  He denies any associated shortness of breath, nausea, or diaphoresis.  He had never experienced this type of pain previously. ED Course: Upon arrival to the ED, patient is found to be afebrile and saturating well on room air with normal heart rate and elevated blood pressure.  EKG demonstrates sinus rhythm with RBBB.  Labs are most notable for normal creatinine, normal WBC, normal troponin x 2, and normal D-dimer.  CTA chest/abdomen/pelvis is negative for PE or other acute abnormality. Patient was given 324 mg aspirin in the ED.  ED physician discussed the case with cardiology who recommended medical admission for stress test.   Consultations: Cardiology  Procedures: Stress test  HOSPITAL COURSE:   Patient underwent stress test and was seen by cardiology. Patient developed symptoms during the stress test. But the test was low risk study. Cleared by  cardiology for discharge. They will arrange close follow up. Patient to continue meds as prescribed. To stay off of Viagra while on NTG.  Obesity Estimated body mass index is 32.57 kg/m as calculated from the following:   Height as of this encounter: 5\' 10"  (1.778 m).   Weight as of this encounter: 103 kg.    PERTINENT LABS:  The results of significant diagnostics from this hospitalization (including imaging, microbiology, ancillary and laboratory) are listed below for reference.     Labs:   Basic Metabolic Panel: Recent Labs  Lab 05/16/23 2141  NA 138  K 3.6  CL 104  CO2 18*  GLUCOSE 111*  BUN 22  CREATININE 0.94  CALCIUM 8.9    CBC: Recent Labs  Lab 05/16/23 2141  WBC 8.7  HGB 16.1  HCT 48.1  MCV 85.6  PLT 167    CBG: Recent Labs  Lab 05/17/23 0738 05/17/23 2049 05/17/23 2352 05/18/23 0346 05/18/23 0824  GLUCAP 101* 98 113* 126* 121*     IMAGING STUDIES NM Myocar Multi W/Spect W/Wall Motion / EF Result Date: 05/17/2023   LV perfusion is normal. There is no evidence of ischemia. There is no evidence of infarction.   Left ventricular function is normal. Nuclear stress EF: 69%. The left ventricular ejection fraction is hyperdynamic (>65%). End diastolic cavity size is normal.   The study is normal. The study is low risk.   CT Angio Chest/Abd/Pel for Dissection W and/or Wo Contrast Result Date: 05/17/2023 CLINICAL DATA:  Acute aortic syndrome, back pain EXAM: CT ANGIOGRAPHY CHEST, ABDOMEN AND PELVIS TECHNIQUE: Non-contrast CT of the chest was initially obtained. Multidetector CT imaging through the  chest, abdomen and pelvis was performed using the standard protocol during bolus administration of intravenous contrast. Multiplanar reconstructed images and MIPs were obtained and reviewed to evaluate the vascular anatomy. RADIATION DOSE REDUCTION: This exam was performed according to the departmental dose-optimization program which includes automated exposure  control, adjustment of the mA and/or kV according to patient size and/or use of iterative reconstruction technique. CONTRAST:  OMNIPAQUE IOHEXOL 350 MG/ML SOLN COMPARISON:  None Available. FINDINGS: CTA CHEST FINDINGS Cardiovascular: Mild cardiomegaly. Mild global cardiomegaly. No pericardial effusion. Central pulmonary arteries are of normal caliber. The thoracic aorta is normal in course and caliber. No intramural hematoma, dissection, or aneurysm. Arch vasculature demonstrates classic anatomic configuration and is widely patent proximally. Mild atherosclerotic calcification. Mediastinum/Nodes: No enlarged mediastinal, hilar, or axillary lymph nodes. Thyroid gland, trachea, and esophagus demonstrate no significant findings. Lungs/Pleura: Lungs are clear. No pleural effusion or pneumothorax. Musculoskeletal: No chest wall abnormality. No acute or significant osseous findings. Review of the MIP images confirms the above findings. CTA ABDOMEN AND PELVIS FINDINGS VASCULAR Aorta: Normal caliber aorta without aneurysm, dissection, vasculitis or significant stenosis. Celiac: Patent without evidence of aneurysm, dissection, vasculitis or significant stenosis. SMA: Patent without evidence of aneurysm, dissection, vasculitis or significant stenosis. Renals: Both renal arteries are patent without evidence of aneurysm, dissection, vasculitis, fibromuscular dysplasia or significant stenosis. IMA: Patent without evidence of aneurysm, dissection, vasculitis or significant stenosis. Inflow: Patent without evidence of aneurysm, dissection, vasculitis or significant stenosis. Veins: No obvious venous abnormality within the limitations of this arterial phase study. Review of the MIP images confirms the above findings. NON-VASCULAR Hepatobiliary: No focal liver abnormality is seen. No gallstones, gallbladder wall thickening, or biliary dilatation. Pancreas: Unremarkable Spleen: Unremarkable Adrenals/Urinary Tract: Adrenal  glands are unremarkable. Kidneys are normal, without renal calculi, focal lesion, or hydronephrosis. Bladder is unremarkable. Stomach/Bowel: Moderate sigmoid diverticulosis. Stomach, small bowel, and large bowel are otherwise unremarkable. Appendix normal. No evidence of obstruction or focal inflammation. No free intraperitoneal gas or fluid. Lymphatic: No pathologic adenopathy Reproductive: Moderate prostatic hypertrophy Other: No abdominal wall hernia or abnormality. No abdominopelvic ascites. Musculoskeletal: No acute or significant osseous findings. Review of the MIP images confirms the above findings. IMPRESSION: 1. No acute intrathoracic or intra-abdominal pathology identified. No aortic aneurysm or dissection. 2. Mild cardiomegaly. 3. Moderate sigmoid diverticulosis without superimposed acute inflammatory change. 4. Moderate prostatic hypertrophy. Aortic Atherosclerosis (ICD10-I70.0). Electronically Signed   By: Helyn Numbers M.D.   On: 05/17/2023 00:04   DG Chest 2 View Result Date: 05/16/2023 CLINICAL DATA:  Chest pain EXAM: CHEST - 2 VIEW COMPARISON:  None Available. FINDINGS: Lungs are well expanded, symmetric, and clear. No pneumothorax or pleural effusion. Cardiac size is mildly enlarged. Pulmonary vascularity is normal. Osseous structures are age-appropriate. No acute bone abnormality. IMPRESSION: 1. Mild cardiomegaly. Electronically Signed   By: Helyn Numbers M.D.   On: 05/16/2023 23:51    DISCHARGE EXAMINATION: Vitals:   05/17/23 1959 05/17/23 2354 05/18/23 0348 05/18/23 0737  BP: (!) 148/90 (!) 142/96 128/81 (!) 150/94  Pulse: (!) 59 (!) 59 62 62  Resp: 17 20 20 17   Temp: 97.7 F (36.5 C) 97.9 F (36.6 C) 97.8 F (36.6 C) 97.9 F (36.6 C)  TempSrc: Oral Oral Oral Oral  SpO2: 95% 95% 93% 96%  Weight:      Height:       General appearance: alert, cooperative, appears stated age, and no distress Resp: clear to auscultation bilaterally Cardio: regular rate and rhythm, S1, S2  normal,  no murmur, click, rub or gallop  DISPOSITION: Home  Discharge Instructions     Call MD for:  difficulty breathing, headache or visual disturbances   Complete by: As directed    Call MD for:  extreme fatigue   Complete by: As directed    Call MD for:  persistant dizziness or light-headedness   Complete by: As directed    Call MD for:  persistant nausea and vomiting   Complete by: As directed    Call MD for:  severe uncontrolled pain   Complete by: As directed    Call MD for:  temperature >100.4   Complete by: As directed    Diet - low sodium heart healthy   Complete by: As directed    Discharge instructions   Complete by: As directed    Please note that you cannot take your Viagra while you are on nitroglycerin.  Please discuss this with the cardiologist when you see them at follow-up appointment.  Till then please do not take Viagra. Cardiologist office will schedule outpatient follow-up.  In the meantime please follow-up with your primary care provider.  You were cared for by a hospitalist during your hospital stay. If you have any questions about your discharge medications or the care you received while you were in the hospital after you are discharged, you can call the unit and asked to speak with the hospitalist on call if the hospitalist that took care of you is not available. Once you are discharged, your primary care physician will handle any further medical issues. Please note that NO REFILLS for any discharge medications will be authorized once you are discharged, as it is imperative that you return to your primary care physician (or establish a relationship with a primary care physician if you do not have one) for your aftercare needs so that they can reassess your need for medications and monitor your lab values. If you do not have a primary care physician, you can call [phone] for a physician referral.   Increase activity slowly   Complete by: As directed          Allergies as of 05/18/2023   No Known Allergies      Medication List     STOP taking these medications    sildenafil 100 MG tablet Commonly known as: VIAGRA       TAKE these medications    atorvastatin 40 MG tablet Commonly known as: LIPITOR TAKE 1 TABLET(40 MG) BY MOUTH DAILY   Jardiance 10 MG Tabs tablet Generic drug: empagliflozin Take 10 mg by mouth daily.   metFORMIN 500 MG tablet Commonly known as: GLUCOPHAGE TAKE 1 TABLET(500 MG) BY MOUTH DAILY AT 6 PM   Multivitamin Men 50+ Tabs Take 1 tablet by mouth daily.   naproxen sodium 220 MG tablet Commonly known as: ALEVE Take 440 mg by mouth as needed (headache,).   nitroGLYCERIN 0.4 MG SL tablet Commonly known as: NITROSTAT Place 1 tablet (0.4 mg total) under the tongue every 5 (five) minutes as needed for chest pain.   omeprazole 40 MG capsule Commonly known as: PRILOSEC Take 1 capsule (40 mg total) by mouth 2 (two) times daily. What changed:  when to take this reasons to take this   valsartan-hydrochlorothiazide 320-25 MG tablet Commonly known as: DIOVAN-HCT Take 1 tablet by mouth daily.          Follow-up Information     Marykay Lex, MD. Schedule an appointment as soon as possible for  a visit.   Specialty: Cardiology Why: post hospitalization follow up Contact information: [address] [place] [address] [phone]                 TOTAL DISCHARGE TIME: 35 mins  Eri Platten Rito Ehrlich  [facility] [employer] on [email or web address]  05/19/2023, 11:50 AM

## 2023-05-18 NOTE — Plan of Care (Signed)
   Problem: Metabolic: Goal: Ability to maintain appropriate glucose levels will improve Outcome: Progressing

## 2023-05-18 NOTE — Plan of Care (Signed)
 Problem: Education: Goal: Ability to describe self-care measures that may prevent or decrease complications (Diabetes Survival Skills Education) will improve 05/18/2023 1032 by Coy Saunas, RN Outcome: Adequate for Discharge 05/18/2023 1017 by Coy Saunas, RN Outcome: Adequate for Discharge Goal: Individualized Educational Video(s) 05/18/2023 1032 by Coy Saunas, RN Outcome: Adequate for Discharge 05/18/2023 1017 by Coy Saunas, RN Outcome: Adequate for Discharge   Problem: Coping: Goal: Ability to adjust to condition or change in health will improve 05/18/2023 1032 by Coy Saunas, RN Outcome: Adequate for Discharge 05/18/2023 1017 by Coy Saunas, RN Outcome: Adequate for Discharge   Problem: Fluid Volume: Goal: Ability to maintain a balanced intake and output will improve 05/18/2023 1032 by Coy Saunas, RN Outcome: Adequate for Discharge 05/18/2023 1017 by Coy Saunas, RN Outcome: Adequate for Discharge   Problem: Health Behavior/Discharge Planning: Goal: Ability to identify and utilize available resources and services will improve 05/18/2023 1032 by Coy Saunas, RN Outcome: Adequate for Discharge 05/18/2023 1017 by Coy Saunas, RN Outcome: Adequate for Discharge Goal: Ability to manage health-related needs will improve 05/18/2023 1032 by Coy Saunas, RN Outcome: Adequate for Discharge 05/18/2023 1017 by Coy Saunas, RN Outcome: Adequate for Discharge   Problem: Metabolic: Goal: Ability to maintain appropriate glucose levels will improve 05/18/2023 1032 by Coy Saunas, RN Outcome: Adequate for Discharge 05/18/2023 1017 by Coy Saunas, RN Outcome: Adequate for Discharge   Problem: Nutritional: Goal: Maintenance of adequate nutrition will improve 05/18/2023 1032 by Coy Saunas, RN Outcome: Adequate for Discharge 05/18/2023 1017 by Coy Saunas, RN Outcome: Adequate for Discharge Goal: Progress toward achieving an optimal weight will  improve 05/18/2023 1032 by Coy Saunas, RN Outcome: Adequate for Discharge 05/18/2023 1017 by Coy Saunas, RN Outcome: Adequate for Discharge   Problem: Skin Integrity: Goal: Risk for impaired skin integrity will decrease 05/18/2023 1032 by Coy Saunas, RN Outcome: Adequate for Discharge 05/18/2023 1017 by Coy Saunas, RN Outcome: Adequate for Discharge   Problem: Tissue Perfusion: Goal: Adequacy of tissue perfusion will improve 05/18/2023 1032 by Coy Saunas, RN Outcome: Adequate for Discharge 05/18/2023 1017 by Coy Saunas, RN Outcome: Adequate for Discharge   Problem: Education: Goal: Understanding of cardiac disease, CV risk reduction, and recovery process will improve 05/18/2023 1032 by Coy Saunas, RN Outcome: Adequate for Discharge 05/18/2023 1017 by Coy Saunas, RN Outcome: Adequate for Discharge Goal: Individualized Educational Video(s) 05/18/2023 1032 by Coy Saunas, RN Outcome: Adequate for Discharge 05/18/2023 1017 by Coy Saunas, RN Outcome: Adequate for Discharge   Problem: Activity: Goal: Ability to tolerate increased activity will improve 05/18/2023 1032 by Coy Saunas, RN Outcome: Adequate for Discharge 05/18/2023 1017 by Coy Saunas, RN Outcome: Adequate for Discharge   Problem: Cardiac: Goal: Ability to achieve and maintain adequate cardiovascular perfusion will improve 05/18/2023 1032 by Coy Saunas, RN Outcome: Adequate for Discharge 05/18/2023 1017 by Coy Saunas, RN Outcome: Adequate for Discharge   Problem: Health Behavior/Discharge Planning: Goal: Ability to safely manage health-related needs after discharge will improve 05/18/2023 1032 by Coy Saunas, RN Outcome: Adequate for Discharge 05/18/2023 1017 by Coy Saunas, RN Outcome: Adequate for Discharge   Problem: Education: Goal: Knowledge of General Education information will improve Description: Including pain rating scale, medication(s)/side effects and  non-pharmacologic comfort measures 05/18/2023 1032 by Coy Saunas, RN Outcome: Adequate for Discharge 05/18/2023 1017 by Coy Saunas, RN  Outcome: Adequate for Discharge   Problem: Health Behavior/Discharge Planning: Goal: Ability to manage health-related needs will improve 05/18/2023 1032 by Coy Saunas, RN Outcome: Adequate for Discharge 05/18/2023 1017 by Coy Saunas, RN Outcome: Adequate for Discharge   Problem: Clinical Measurements: Goal: Ability to maintain clinical measurements within normal limits will improve 05/18/2023 1032 by Coy Saunas, RN Outcome: Adequate for Discharge 05/18/2023 1017 by Coy Saunas, RN Outcome: Adequate for Discharge Goal: Will remain free from infection 05/18/2023 1032 by Coy Saunas, RN Outcome: Adequate for Discharge 05/18/2023 1017 by Coy Saunas, RN Outcome: Adequate for Discharge Goal: Diagnostic test results will improve 05/18/2023 1032 by Coy Saunas, RN Outcome: Adequate for Discharge 05/18/2023 1017 by Coy Saunas, RN Outcome: Adequate for Discharge Goal: Respiratory complications will improve 05/18/2023 1032 by Coy Saunas, RN Outcome: Adequate for Discharge 05/18/2023 1017 by Coy Saunas, RN Outcome: Adequate for Discharge Goal: Cardiovascular complication will be avoided 05/18/2023 1032 by Coy Saunas, RN Outcome: Adequate for Discharge 05/18/2023 1017 by Coy Saunas, RN Outcome: Adequate for Discharge   Problem: Activity: Goal: Risk for activity intolerance will decrease 05/18/2023 1032 by Coy Saunas, RN Outcome: Adequate for Discharge 05/18/2023 1017 by Coy Saunas, RN Outcome: Adequate for Discharge   Problem: Nutrition: Goal: Adequate nutrition will be maintained 05/18/2023 1032 by Coy Saunas, RN Outcome: Adequate for Discharge 05/18/2023 1017 by Coy Saunas, RN Outcome: Adequate for Discharge   Problem: Coping: Goal: Level of anxiety will decrease 05/18/2023 1032 by Coy Saunas, RN Outcome: Adequate for Discharge 05/18/2023 1017 by Coy Saunas, RN Outcome: Adequate for Discharge   Problem: Elimination: Goal: Will not experience complications related to bowel motility 05/18/2023 1032 by Coy Saunas, RN Outcome: Adequate for Discharge 05/18/2023 1017 by Coy Saunas, RN Outcome: Adequate for Discharge Goal: Will not experience complications related to urinary retention 05/18/2023 1032 by Coy Saunas, RN Outcome: Adequate for Discharge 05/18/2023 1017 by Coy Saunas, RN Outcome: Adequate for Discharge   Problem: Pain Managment: Goal: General experience of comfort will improve and/or be controlled 05/18/2023 1032 by Coy Saunas, RN Outcome: Adequate for Discharge 05/18/2023 1017 by Coy Saunas, RN Outcome: Adequate for Discharge   Problem: Safety: Goal: Ability to remain free from injury will improve 05/18/2023 1032 by Coy Saunas, RN Outcome: Adequate for Discharge 05/18/2023 1017 by Coy Saunas, RN Outcome: Adequate for Discharge   Problem: Skin Integrity: Goal: Risk for impaired skin integrity will decrease 05/18/2023 1032 by Coy Saunas, RN Outcome: Adequate for Discharge 05/18/2023 1017 by Coy Saunas, RN Outcome: Adequate for Discharge

## 2023-05-18 NOTE — Progress Notes (Signed)
 Se habl con el paciente/cuidador sobre las instrucciones para el alta (incluyendo medicamentos) y se le proporcion una copia  Patients son was in the room to interrupt the discharge instructions all questions were answered and both understand the instructions.

## 2023-06-23 ENCOUNTER — Encounter: Payer: Self-pay | Admitting: Cardiology

## 2023-06-23 ENCOUNTER — Ambulatory Visit: Attending: Cardiology | Admitting: Cardiology

## 2023-06-23 VITALS — BP 140/90 | HR 77 | Ht 70.0 in | Wt 209.2 lb

## 2023-06-23 DIAGNOSIS — I200 Unstable angina: Secondary | ICD-10-CM

## 2023-06-23 DIAGNOSIS — E785 Hyperlipidemia, unspecified: Secondary | ICD-10-CM

## 2023-06-23 DIAGNOSIS — E1159 Type 2 diabetes mellitus with other circulatory complications: Secondary | ICD-10-CM

## 2023-06-23 DIAGNOSIS — R072 Precordial pain: Secondary | ICD-10-CM | POA: Diagnosis not present

## 2023-06-23 DIAGNOSIS — E1169 Type 2 diabetes mellitus with other specified complication: Secondary | ICD-10-CM

## 2023-06-23 DIAGNOSIS — I152 Hypertension secondary to endocrine disorders: Secondary | ICD-10-CM

## 2023-06-23 NOTE — Patient Instructions (Signed)
 Medication Instructions:    Your physician recommends that you continue on your current medications as directed. Please refer to the Current Medication list given to you today.  *If you need a refill on your cardiac medications before your next appointment, please call your pharmacy*  Lab Work: NONE ORDERED  TODAY    If you have labs (blood work) drawn today and your tests are completely normal, you will receive your results only by: MyChart Message (if you have MyChart) OR A paper copy in the mail If you have any lab test that is abnormal or we need to change your treatment, we will call you to review the results.  Testing/Procedures: NONE ORDERED  TODAY    Follow-Up: At [facility], you and your health needs are our priority.  As part of our continuing mission to provide you with exceptional heart care, our providers are all part of one team.  This team includes your primary Cardiologist (physician) and Advanced Practice Providers or APPs (Physician Assistants and Nurse Practitioners) who all work together to provide you with the care you need, when you need it.  Your next appointment:    4- 6 week(s)  Provider:  Nicolette Barrio / AVAILABLE    We recommend signing up for the patient portal called "MyChart".  Sign up information is provided on this After Visit Summary.  MyChart is used to connect with patients for Virtual Visits (Telemedicine).  Patients are able to view lab/test results, encounter notes, upcoming appointments, etc.  Non-urgent messages can be sent to your provider as well.   To learn more about what you can do with MyChart, go to [email or web address].   Other Instructions       1st Floor: - Lobby - Registration  - Pharmacy  - Lab - Cafe  2nd Floor: - PV Lab - Diagnostic Testing (echo, CT, nuclear med)  3rd Floor: - Vacant  4th Floor: - TCTS (cardiothoracic surgery) - AFib Clinic - Structural Heart Clinic - Vascular Surgery  -  Vascular Ultrasound  5th Floor: - HeartCare Cardiology (general and EP) - Clinical Pharmacy for coumadin, hypertension, lipid, weight-loss medications, and med management appointments    Valet parking services will be available as well.

## 2023-06-23 NOTE — Progress Notes (Signed)
 Cardiology Office Note:   Date:  06/23/2023  ID:  Michael Mitchell, DOB 01/13/1961, MRN [id number] PCP: Arleen Lacer, MD  [facility] Providers Cardiologist:  Randene Bustard, MD    History of Present Illness:   Discussed the use of AI scribe software for clinical note transcription with the patient, who gave verbal consent to proceed.  History of Present Illness Michael Mitchell is a 63 year old male with hypertension, hyperlipidemia, type 2 diabetes, and intermittent chest pain who presents for a one-month follow-up after a symptomatic stress test.  He initially presented to the emergency department on March 11th for evaluation of chest discomfort that began upon awakening on March 10th. The pain was located on the left lateral chest wall near the axilla with radiation to his left arm and lasted throughout the day. It resolved spontaneously while at the hospital. An EKG showed right bundle branch block and nonspecific T wave inversion, but troponins were negative. He was admitted and scheduled for a Lexiscan  stress test. During the stress portion, he became acutely diaphoretic with visible shortness of breath and severe substernal chest pain, which resolved within five minutes after administration of nitroglycerin . He reported that these symptoms were not similar to those that prompted his ED visit.  In 2020, he had similar chest pain and was evaluated with a low-risk stress test and a normal echocardiogram. This recent inpatient Lexiscan  imaging showed normal left ventricular perfusion with no evidence of ischemia and a normal left ventricular ejection fraction. Patient ultimately discharged without further testing. Since the stress test, he feels well without recurrence of chest pain or shortness of breath. He has not used nitroglycerin  since discharge. He walks about two miles in his neighborhood twice a week without discomfort. He notes that he used to feel poorly when walking uphill  when he was heavier, but now feels better after losing weight.  He is currently taking valsartan  and hydrochlorothiazide  for blood pressure management. He was previously on lisinopril  20 mg, which was changed due to persistent high blood pressure.  He monitors his blood pressure at home, noting readings around 146/94. He wants to make dietary changes to manage his blood pressure.  Today patient denies chest pain, shortness of breath, lower extremity edema, fatigue, palpitations, melena, hematuria, hemoptysis, diaphoresis, weakness, presyncope, syncope, orthopnea, and PND.   Studies Reviewed:    EKG:   EKG Interpretation Date/Time:  Friday June 23 2023 10:42:11 EDT Ventricular Rate:  68 PR Interval:  180 QRS Duration:  170 QT Interval:  448 QTC Calculation: 476 R Axis:   250  Text Interpretation: Normal sinus rhythm Indeterminate axis Right bundle branch block When compared with ECG of 16-May-2023 21:18, No significant change was found Confirmed by Leala Prince [phone]) on 06/23/2023 10:48:35 AM   05/17/23 TTE    LV perfusion is normal. There is no evidence of ischemia. There is no evidence of infarction.   Left ventricular function is normal. Nuclear stress EF: 69%. The left ventricular ejection fraction is hyperdynamic (>65%). End diastolic cavity size is normal.   The study is normal. The study is low risk.   Risk Assessment/Calculations:     HYPERTENSION CONTROL Vitals:   06/23/23 1001 06/23/23 1030  BP: (!) 138/92 (!) 140/90    The patient's blood pressure is elevated above target today.  In order to address the patient's elevated BP: Blood pressure will be monitored at home to determine if medication changes need to be made. (Dietary changes)  Physical Exam:   VS:  BP (!) 140/90   Pulse 77   Ht 5\' 10"  (1.778 m)   Wt 209 lb 3.2 oz (94.9 kg)   SpO2 98%   BMI 30.02 kg/m    Wt Readings from Last 3 Encounters:  06/23/23 209 lb 3.2 oz (94.9 kg)  05/16/23  227 lb (103 kg)  04/22/21 227 lb (103 kg)     Physical Exam Vitals reviewed.  Constitutional:      Appearance: Normal appearance.  HENT:     Head: Normocephalic.  Eyes:     Pupils: Pupils are equal, round, and reactive to light.  Cardiovascular:     Rate and Rhythm: Normal rate and regular rhythm.     Pulses: Normal pulses.     Heart sounds: Normal heart sounds.  Pulmonary:     Effort: Pulmonary effort is normal.     Breath sounds: Normal breath sounds.  Abdominal:     General: Abdomen is flat.     Palpations: Abdomen is soft.  Musculoskeletal:     Right lower leg: No edema.     Left lower leg: No edema.  Skin:    General: Skin is warm and dry.     Capillary Refill: Capillary refill takes less than 2 seconds.  Neurological:     General: No focal deficit present.     Mental Status: He is alert and oriented to person, place, and time.  Psychiatric:        Mood and Affect: Mood normal.        Behavior: Behavior normal.        Thought Content: Thought content normal.        Judgment: Judgment normal.       ASSESSMENT AND PLAN:     Assessment and Plan Assessment & Plan Chest Pain Intermittent chest pain initially presented to ED on March 10th, characterized by left lateral chest wall pain radiating to the left arm, resolved spontaneously. During a Lexiscan  stress test, he experienced severe substernal chest pain, dyspnea, and diaphoresis, which resolved with nitroglycerin . Stress imaging showed normal left ventricular perfusion and no ischemia. Symptoms during the stress test were suspected to be a global reaction to Lexiscan  rather than true ischemia. No recurrence of symptoms since the stress test, supporting a non-cardiac cause. Patient with no exertional symptoms since discharge, walks 2 miles at least twice per week. Stable ECG tracing today with RBBB. - Continue to monitor for symptoms of dyspnea, chest pain, exertional intolerance  Hypertension Blood pressure  remains elevated with home readings around 146/94 mmHg. Currently on maximum dose valsartan  and hydrochlorothiazide . Discussed potential need for additional medication if dietary changes do not improve blood pressure. He prefers dietary modifications first. Advised that high salt and high-fat diets contribute to hypertension and heart disease. - Provide dietary recommendations to reduce blood pressure - Schedule follow-up appointment in ~1 month to reassess blood pressure - Consider adding Amlodipine if blood pressure remains high  Hyperlipidemia LDL 66mg /dL as of 16/10/96. LPA elevated at 152.3. Given excellent LDL in patient with only mild atherosclerotic calcifications seen on recent CTA chest, LPA elevation not an acute issue. - Continue Atorvastatin  40 and consider lipid clinic referral  Medication Management Inquired about sildenafil  and nitroglycerin  use. Advised that nitroglycerin  was given as an emergency medication and should not be taken within 48 hours of sildenafil  due to risk of acute hypotension. No cardiac contraindications to sildenafil  use from a cardiac standpoint. Decision on sildenafil  use should  be made with primary care provider.           Signed, Leala Prince, PA-C

## 2023-09-04 ENCOUNTER — Ambulatory Visit: Attending: Cardiology | Admitting: Cardiology

## 2023-09-04 ENCOUNTER — Encounter: Payer: Self-pay | Admitting: Cardiology

## 2023-09-04 VITALS — BP 144/82 | HR 54 | Ht 70.0 in | Wt 213.6 lb

## 2023-09-04 DIAGNOSIS — E1159 Type 2 diabetes mellitus with other circulatory complications: Secondary | ICD-10-CM | POA: Diagnosis not present

## 2023-09-04 DIAGNOSIS — R072 Precordial pain: Secondary | ICD-10-CM | POA: Diagnosis not present

## 2023-09-04 DIAGNOSIS — E785 Hyperlipidemia, unspecified: Secondary | ICD-10-CM

## 2023-09-04 DIAGNOSIS — E1169 Type 2 diabetes mellitus with other specified complication: Secondary | ICD-10-CM | POA: Diagnosis not present

## 2023-09-04 DIAGNOSIS — I152 Hypertension secondary to endocrine disorders: Secondary | ICD-10-CM | POA: Diagnosis not present

## 2023-09-04 MED ORDER — AMLODIPINE BESYLATE 5 MG PO TABS: 5.0000 mg | ORAL_TABLET | Freq: Every day | ORAL | 1 refills | Status: DC

## 2023-09-04 NOTE — Progress Notes (Signed)
 Cardiology Office Note:   Date:  09/04/2023  ID:  Lenford Beddow, DOB [birth year], MRN [id number] PCP: Anner Alm ORN, MD  [facility] Providers Cardiologist:  Alm Anner, MD    History of Present Illness:   Discussed the use of AI scribe software for clinical note transcription with the patient, who gave verbal consent to proceed.  History of Present Illness Michael Mitchell is a 63 year old male with hypertension, hyperlipidemia, type 2 diabetes, and intermittent chest pain. He presents for follow up today.   Since our last visit, he has been monitoring his blood pressure at home, with readings typically around 140/80, though he noted a reading of 142/87 two days ago and 119/79 this morning.  He recalls a past episode of brief pain in his chest/side while bending to touch his foot. He has otherwise not experienced recent chest pain or shortness of breath, symptoms he had previously during a stress test.  He has a history of acid reflux, which previously caused nocturnal gasping for air. These symptoms have resolved since starting omeprazole . He denies any diagnosis of sleep apnea and is not aware of any snoring or gasping for air during sleep.  He describes his sleep pattern as waking up at 4 AM for work after going to bed around 10:30 PM, a routine he has maintained for several years. He does not report waking due to breathing difficulties. No recent chest pain, shortness of breath, or leg swelling.   Today patient denies chest pain, shortness of breath, lower extremity edema, fatigue, palpitations, melena, hematuria, hemoptysis, diaphoresis, weakness, presyncope, syncope, orthopnea, and PND.   Studies Reviewed:    EKG:        Risk Assessment/Calculations:     HYPERTENSION CONTROL Vitals:   09/04/23 1547 09/04/23 1645  BP: (!) 146/84 (!) 144/82    The patient's blood pressure is elevated above target today.  In order to address the patient's elevated BP: A new  medication was prescribed today.           Physical Exam:   VS:  BP (!) 144/82   Pulse (!) 54   Ht 5' 10 (1.778 m)   Wt 213 lb 9.6 oz (96.9 kg)   SpO2 96%   BMI 30.65 kg/m    Wt Readings from Last 3 Encounters:  09/04/23 213 lb 9.6 oz (96.9 kg)  06/23/23 209 lb 3.2 oz (94.9 kg)  05/16/23 227 lb (103 kg)     Physical Exam Vitals reviewed.  Constitutional:      Appearance: Normal appearance.  HENT:     Head: Normocephalic.   Eyes:     Pupils: Pupils are equal, round, and reactive to light.    Cardiovascular:     Rate and Rhythm: Normal rate and regular rhythm.     Pulses: Normal pulses.     Heart sounds: Normal heart sounds.  Pulmonary:     Effort: Pulmonary effort is normal.     Breath sounds: Normal breath sounds.  Abdominal:     General: Abdomen is flat.     Palpations: Abdomen is soft.   Musculoskeletal:     Right lower leg: No edema.     Left lower leg: No edema.   Skin:    General: Skin is warm and dry.     Capillary Refill: Capillary refill takes less than 2 seconds.   Neurological:     General: No focal deficit present.     Mental Status: He is alert and oriented  to person, place, and time.   Psychiatric:        Mood and Affect: Mood normal.        Behavior: Behavior normal.        Thought Content: Thought content normal.        Judgment: Judgment normal.      ASSESSMENT AND PLAN:    Assessment & Plan Hypertension Blood pressure readings have been variable, with some readings above the target of 130/80 mmHg. Current medication is at maximum dose, necessitating consideration of an additional antihypertensive medication. Discussed the potential risks of uncontrolled hypertension, including kidney damage, heart failure. Emphasized the importance of lifestyle modifications such as weight loss, physical activity, and dietary changes, particularly reducing salt intake from processed foods. Shared decision-making led to the agreement to start an  additional medication to better control blood pressure. - Prescribe amlodipine 5 mg daily. - Continue Valsartan /hydrochlorothiazide  320mg -25mg  - Instruct to take one blood pressure medication in the morning and the other at night. - Advise to monitor blood pressure regularly with a goal of maintaining readings below 130/80 mmHg. - Plan to call in a few weeks to assess blood pressure control. - Schedule follow-up appointment in six months, with earlier review if blood pressure remains high.  Chest Pain Intermittent chest pain initially presented to ED on March 10th, characterized by left lateral chest wall pain radiating to the left arm, resolved spontaneously. During a Lexiscan  stress test, he experienced severe substernal chest pain, dyspnea, and diaphoresis, which resolved with nitroglycerin . Stress imaging showed normal left ventricular perfusion and no ischemia. Symptoms during the stress test were suspected to be a global reaction to Lexiscan  rather than true ischemia. Still without recurrence of symptoms since the stress test, supporting a non-cardiac cause. - Continue to monitor for symptoms of dyspnea, chest pain, exertional intolerance   Hyperlipidemia LDL 66mg /dL as of 96/87/74. LPA elevated at 152.3. Given excellent LDL in patient with only mild atherosclerotic calcifications seen on recent CTA chest, LPA elevation not an acute issue. - Continue Atorvastatin  40          Signed, Artist Pouch, PA-C

## 2023-09-04 NOTE — Patient Instructions (Addendum)
 Medication Instructions:  Begin Amlodipine 5mg . Take one tablet daily.   *If you need a refill on your cardiac medications before your next appointment, please call your pharmacy*   Lab Work: No labs were ordered during today's visit.  If you have labs (blood work) drawn today and your tests are completely normal, you will receive your results only by: MyChart Message (if you have MyChart) OR A paper copy in the mail If you have any lab test that is abnormal or we need to change your treatment, we will call you to review the results.   Testing/Procedures: No procedures were ordered during today's visit.    Follow-Up: At [facility], you and your health needs are our priority.  As part of our continuing mission to provide you with exceptional heart care, we have created designated Provider Care Teams.  These Care Teams include your primary Cardiologist (physician) and Advanced Practice Providers (APPs -  Physician Assistants and Nurse Practitioners) who all work together to provide you with the care you need, when you need it.  We recommend signing up for the patient portal called MyChart.  Sign up information is provided on this After Visit Summary.  MyChart is used to connect with patients for Virtual Visits (Telemedicine).  Patients are able to view lab/test results, encounter notes, upcoming appointments, etc.  Non-urgent messages can be sent to your provider as well.   To learn more about what you can do with MyChart, go to [email or web address].    Your next appointment:   6 month(s)  Provider:   Alm Clay, MD    Other Instructions Thank you for choosing [facility]!

## 2023-09-20 ENCOUNTER — Telehealth: Payer: Self-pay | Admitting: *Deleted

## 2023-09-20 NOTE — Telephone Encounter (Signed)
-----   Message from Artist Pouch sent at 09/18/2023 10:21 AM EDT ----- Regarding: BP check in This patient had borderline BP at last office visit and we discussed a check in 1-2 weeks following his visit. He does not have an active MyChart. At your convenience, please call patient to inquire about BP readings since our visit.  Thanks so much!  Artist Pouch, PA-C

## 2023-09-20 NOTE — Telephone Encounter (Signed)
 Called pt using  [employer], [place], [place] [id number], we got pt on the line, but he advised the interpreter that he would return the call that he couldn't talk.

## 2024-02-16 ENCOUNTER — Ambulatory Visit: Attending: Internal Medicine | Admitting: Cardiology

## 2024-02-16 VITALS — BP 122/78 | HR 70 | Ht 70.0 in | Wt 207.0 lb

## 2024-02-16 DIAGNOSIS — E1159 Type 2 diabetes mellitus with other circulatory complications: Secondary | ICD-10-CM | POA: Diagnosis not present

## 2024-02-16 DIAGNOSIS — R072 Precordial pain: Secondary | ICD-10-CM | POA: Diagnosis not present

## 2024-02-16 DIAGNOSIS — I152 Hypertension secondary to endocrine disorders: Secondary | ICD-10-CM | POA: Diagnosis not present

## 2024-02-16 DIAGNOSIS — E785 Hyperlipidemia, unspecified: Secondary | ICD-10-CM

## 2024-02-16 MED ORDER — VALSARTAN-HYDROCHLOROTHIAZIDE 320-25 MG PO TABS: 1.0000 | ORAL_TABLET | Freq: Every day | ORAL | 3 refills | Status: AC

## 2024-02-16 MED ORDER — ATORVASTATIN CALCIUM 80 MG PO TABS: 80.0000 mg | ORAL_TABLET | Freq: Every day | ORAL | 3 refills | Status: AC

## 2024-02-16 MED ORDER — AMLODIPINE BESYLATE 5 MG PO TABS: 5.0000 mg | ORAL_TABLET | Freq: Every day | ORAL | 3 refills | Status: AC

## 2024-02-16 NOTE — Progress Notes (Signed)
 Cardiology Office Note:  .   Date:  02/18/2024  ID:  Real Luna, DOB 1961-01-29, MRN [id number] PCP: Freddrick Johns  [facility] Providers Cardiologist:  Alm Clay, MD     Chief Complaint  Patient presents with   Follow-up    No further issues of chest pain.  Currently being evaluated for occult stroke    Patient Profile: .     Keisean Skowron is a Spanish-speaking 63 y.o. adult  man with a PMH notable for HTN, HLD, DM-2, and radiographic evidence of occult CVA who presents here for 65-month follow-up at the request of Clay Alm ORN, MD.  I saw the patient in consultation on May 17, 2023 for evaluation of chest pain.  I had previously seen him back in 2020 for chest pain and he had a low risk stress test and echocardiogram.     Aceson Labell was last seen on September 04, 2023 by Artist Pouch, PA for follow-up.  His blood pressures have been typically reading about 140/80 to 142/87 but was 119/79 in the clinic visit.  He had a brief episode of chest pain but had not had any recent chest pain or shortness of breath since his stress test.  Did note some GERD symptoms.  He notes that at he goes to bed at roughly 10:30 PM and wakes up at 4 AM.  He was on amlodipine  5 mg daily, valsartan /HCTZ 320-25 mg daily for blood pressure with a goal blood pressure reading of less than 130/80.  His intermittent chest pain has been since significant proved  Subjective  Discussed the use of AI scribe software for clinical note transcription with the patient, who gave verbal consent to proceed. History is assisted with the use of contracted Spanish interpreter, [employer] History of Present Illness Keagan Brislin is a 63 year old male with hypertension and coronary artery disease who presents for follow-up after a previous episode of chest pain. He is accompanied by his wife.    He has a history of chest pain, which was evaluated in March during a hospital visit. No recent episodes of  chest pain, shortness of breath during daily activities, or swelling in his legs. He occasionally experiences dizziness when moving from a bent position to standing, which he attributes to his blood pressure medication. He notes feeling his heart race when he loses his temper, but not during routine activities.  In June 2020, an echocardiogram showed low normal pump function at 50-55%, but no other abnormalities. A stress test in July 2020 showed no evidence of blockages. During a stress test in March, he experienced symptoms after receiving medication, including headache and nausea, but no chest or arm pain.  He is currently taking amlodipine  5 mg, aspirin  81 mg, atorvastatin  80 mg, Jardiance  10 mg, metformin  500 mg, omeprazole , and valsartan  HCTZ 320/25 mg. He has not needed to use nitroglycerin .  In January 2024, he fell at work and a CT scan revealed a previous stroke, though he was unaware of any such event. He is awaiting a carotid doppler study, which has been ordered but not yet completed.  He has had an echocardiogram by [facility] neurology in addition to carotid Dopplers the echo has been done with a Doppler not yet done  Cardiovascular ROS: no chest pain or dyspnea on exertion negative for - edema, irregular heartbeat, murmur, orthopnea, palpitations, paroxysmal nocturnal dyspnea, rapid heart rate, shortness of breath, or syncope or near syncope, TIA or amaurosis fugax, claudication  Objective   Medications: Aspirin  81 mg daily; amlodipine  5 mg daily, valsartan -HCTZ 320-25 mg daily; atorvastatin  80 mg daily; Jardiance  10 mg daily, metformin  500 mg daily; omeprazole  40 mL daily  Studies Reviewed: SABRA        Lab Results  Component Value Date   CHOL 117 05/17/2023   HDL 37 (L) 05/17/2023   LDLCALC 66 05/17/2023   TRIG 72 05/17/2023   CHOLHDL 3.2 05/17/2023   Lab Results  Component Value Date   NA 138 05/16/2023   CL 104 05/16/2023   K 3.6 05/16/2023   CO2 18 (L) 05/16/2023    BUN 22 05/16/2023   CREATININE 0.94 05/16/2023   GFRNONAA >60 05/16/2023   CALCIUM  8.9 05/16/2023   ALBUMIN 4.4 01/23/2020   GLUCOSE 111 (H) 05/16/2023    Lab Results  Component Value Date   HGBA1C 6.6 (H) 05/17/2023    Lexiscan  Myoview  : Initially exercised 8 minutes only reaching 83% of max predicted heart rate with dyspnea and chest pain 10 out of 10.  There is no evidence of ischemia or infarction.  Normal EF => when I talked to the patient, he said he really had more of a headache than true chest pain with the medication.  (05/17/2023) Echocardiogram: Normal LV size and function-EF  55-60%, no wall motion abnormalities, normal valves, no right to left shunt (12/2023)   Prior Studies: Treadmill Myoview  09/05/2018: EF 51%.  LOW RISK.  No ischemia or infarction. Echocardiogram 08/07/2018: Normal LV size and function with a EF of 50 to 55%.  G1 DD.  Normal RV size and function.  Normal valves.  Risk Assessment/Calculations:         Physical Exam:   VS:  BP 122/78 (BP Location: Right Arm, Patient Position: Sitting, Cuff Size: Normal)   Pulse 70   Ht 5' 10 (1.778 m)   Wt 207 lb (93.9 kg)   SpO2 96%   BMI 29.70 kg/m    Wt Readings from Last 3 Encounters:  02/16/24 207 lb (93.9 kg)  09/04/23 213 lb 9.6 oz (96.9 kg)  06/23/23 209 lb 3.2 oz (94.9 kg)    GEN: Well nourished, well developed; in no acute distress; otherwise healthy appearing. NECK: No JVD; No carotid bruits CARDIAC: Normal S1, S2; RRR, no murmurs, rubs, gallops RESPIRATORY:  Clear to auscultation without rales, wheezing or rhonchi ; nonlabored, good air movement. ABDOMEN: Soft, non-tender, non-distended EXTREMITIES:  No edema; No deformity     ASSESSMENT AND PLAN: .     Hypertension associated with diabetes (HCC) Well-controlled with current medication. Blood pressure within target range. No symptoms of dizziness, orthostatic hypotension, chest pain, or shortness of breath. Echocardiogram normal. No new stroke  symptoms per neurology. - Continue amlodipine  5 mg, valsartan  HCTZ 320/25 mg. - Monitor blood pressure regularly. - Follow up with primary care for hypertension management. - Schedule cardiology follow-up in one year unless managed by primary care.  A1c 6.6-managed by PCP with metformin  and Jardiance .   Hyperlipidemia LDL goal <100 Labs as of March 2025 showed normal 12 lipids with an LDL of 66 on atorvastatin  80 mg-increased during his hospitalization.  Can be monitored by PCP.  Precordial chest pain Chest pain evaluated in March 2025 with a Lexiscan  Myoview  was nonischemic.  Interestingly, he did not quite restarted heart rate of 85% (only reading 83%) having no chest pain.  But because he did not meet 85%, they switched to Lexiscan  which then caused her to have some discomfort.  Thankfully  the images were nonischemic.  Unlikely that the chest pain was cardiac in nature but reasonable to continue to treat risk factors with a blood pressure, cholesterol and diabetes.  Cardiovascular and Mediastinum Overall stable cardiovascular standpoint with well-controlled blood pressure, lipids and pretty well-controlled diabetes.  No further chest pain. CT scan suggested occult stroke with no residual symptoms. Continue 81 mg aspirin  along with high-dose atorvastatin  80 mg for lipid management Continue blood pressure control with ARB and beta-blocker Continue glycemic control-currently using metformin  and Jardiance .  Assessment & Plan Hypertension   Recording duration: 26 minutes       Follow-Up: Return in about 1 year (around 02/15/2025) for [employer].  I spent 52 minutes in the care of [facility] today including reviewing labs (1 minute), reviewing studies (Lexiscan  Myoview  x 2 and echocardiogram  reviewed 6 minutes), reviewing outside studies (echocardiogram and head CT from [facility] reviewed-4 minutes), face to face time discussing treatment options (25  minutes), reviewing records from inpatient consult as well as several follow-up visits with Artist Pouch, PA, and neurology consult (7 minutes), 9 minutes dictating, and documenting in the encounter.      Signed, Alm MICAEL Clay, MD, MS Alm Clay, M.D., M.S. Interventional Cardiologist  [facility] Pager # [phone]

## 2024-02-16 NOTE — Patient Instructions (Signed)
 Medication Instructions:   No chnages  *If you need a refill on your cardiac medications before your next appointment, please call your pharmacy*   Lab Work:  Not needed   Testing/Procedures:  Not needed  Follow-Up: At [facility], you and your health needs are our priority.  As part of our continuing mission to provide you with exceptional heart care, we have created designated Provider Care Teams.  These Care Teams include your primary Cardiologist (physician) and Advanced Practice Providers (APPs -  Physician Assistants and Nurse Practitioners) who all work together to provide you with the care you need, when you need it.  We recommend signing up for the patient portal called MyChart.  Sign up information is provided on this After Visit Summary.  MyChart is used to connect with patients for Virtual Visits (Telemedicine).  Patients are able to view lab/test results, encounter notes, upcoming appointments, etc.  Non-urgent messages can be sent to your provider as well.   To learn more about what you can do with MyChart, go to [email or web address].    Your next appointment:   12 month(s)  The format for your next appointment:   In Person  Provider:   Alm Clay, MD

## 2024-02-18 ENCOUNTER — Encounter: Payer: Self-pay | Admitting: Cardiology

## 2024-02-18 NOTE — Assessment & Plan Note (Signed)
 Overall stable cardiovascular standpoint with well-controlled blood pressure, lipids and pretty well-controlled diabetes.  No further chest pain. CT scan suggested occult stroke with no residual symptoms. Continue 81 mg aspirin  along with high-dose atorvastatin  80 mg for lipid management Continue blood pressure control with ARB and beta-blocker Continue glycemic control-currently using metformin  and Jardiance .

## 2024-02-18 NOTE — Assessment & Plan Note (Signed)
 Chest pain evaluated in March 2025 with a Lexiscan  Myoview  was nonischemic.  Interestingly, he did not quite restarted heart rate of 85% (only reading 83%) having no chest pain.  But because he did not meet 85%, they switched to Lexiscan  which then caused her to have some discomfort.  Thankfully the images were nonischemic.  Unlikely that the chest pain was cardiac in nature but reasonable to continue to treat risk factors with a blood pressure, cholesterol and diabetes.

## 2024-02-18 NOTE — Assessment & Plan Note (Signed)
 Labs as of March 2025 showed normal 12 lipids with an LDL of 66 on atorvastatin  80 mg-increased during his hospitalization.  Can be monitored by PCP.

## 2024-02-18 NOTE — Assessment & Plan Note (Signed)
 Well-controlled with current medication. Blood pressure within target range. No symptoms of dizziness, orthostatic hypotension, chest pain, or shortness of breath. Echocardiogram normal. No new stroke symptoms per neurology. - Continue amlodipine  5 mg, valsartan  HCTZ 320/25 mg. - Monitor blood pressure regularly. - Follow up with primary care for hypertension management. - Schedule cardiology follow-up in one year unless managed by primary care.  A1c 6.6-managed by PCP with metformin  and Jardiance .
